# Patient Record
Sex: Female | Born: 1970 | Marital: Married | Smoking: Never smoker
Health system: Southern US, Community
[De-identification: ages and names within clinical notes are randomized; demographics above are authoritative.]

## PROBLEM LIST (undated history)

## (undated) DIAGNOSIS — D649 Anemia, unspecified: Secondary | ICD-10-CM

## (undated) DIAGNOSIS — K219 Gastro-esophageal reflux disease without esophagitis: Secondary | ICD-10-CM

## (undated) DIAGNOSIS — H5703 Miosis: Secondary | ICD-10-CM

## (undated) DIAGNOSIS — K2950 Unspecified chronic gastritis without bleeding: Secondary | ICD-10-CM

## (undated) DIAGNOSIS — B001 Herpesviral vesicular dermatitis: Secondary | ICD-10-CM

## (undated) DIAGNOSIS — L509 Urticaria, unspecified: Secondary | ICD-10-CM

## (undated) DIAGNOSIS — E559 Vitamin D deficiency, unspecified: Secondary | ICD-10-CM

## (undated) DIAGNOSIS — H5203 Hypermetropia, bilateral: Secondary | ICD-10-CM

## (undated) DIAGNOSIS — N939 Abnormal uterine and vaginal bleeding, unspecified: Secondary | ICD-10-CM

## (undated) DIAGNOSIS — H52223 Regular astigmatism, bilateral: Secondary | ICD-10-CM

## (undated) DIAGNOSIS — L719 Rosacea, unspecified: Secondary | ICD-10-CM

## (undated) DIAGNOSIS — T7840XA Allergy, unspecified, initial encounter: Secondary | ICD-10-CM

## (undated) DIAGNOSIS — H04123 Dry eye syndrome of bilateral lacrimal glands: Secondary | ICD-10-CM

## (undated) DIAGNOSIS — J45909 Unspecified asthma, uncomplicated: Secondary | ICD-10-CM

## (undated) DIAGNOSIS — E7849 Other hyperlipidemia: Secondary | ICD-10-CM

## (undated) HISTORY — DX: Gastro-esophageal reflux disease without esophagitis: K21.9

## (undated) HISTORY — DX: Unspecified asthma, uncomplicated: J45.909

## (undated) HISTORY — DX: Vitamin D deficiency, unspecified: E55.9

## (undated) HISTORY — DX: Allergy, unspecified, initial encounter: T78.40XA

---
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
## 2012-09-29 HISTORY — PX: ESOPHAGOGASTRODUODENOSCOPY: SHX1529

## 2013-02-09 LAB — HM PAP SMEAR: HM Pap smear: NEGATIVE

## 2013-08-01 LAB — HM MAMMOGRAPHY: HM MAMMO: NORMAL

## 2014-01-04 ENCOUNTER — Telehealth: Payer: Self-pay | Admitting: *Deleted

## 2014-01-04 ENCOUNTER — Other Ambulatory Visit: Payer: Self-pay | Admitting: Adult Health

## 2014-01-04 ENCOUNTER — Encounter: Payer: Self-pay | Admitting: Adult Health

## 2014-01-04 ENCOUNTER — Encounter (INDEPENDENT_AMBULATORY_CARE_PROVIDER_SITE_OTHER): Payer: Self-pay

## 2014-01-04 ENCOUNTER — Ambulatory Visit (INDEPENDENT_AMBULATORY_CARE_PROVIDER_SITE_OTHER): Payer: 59 | Admitting: Adult Health

## 2014-01-04 ENCOUNTER — Encounter: Payer: Self-pay | Admitting: *Deleted

## 2014-01-04 VITALS — BP 102/60 | HR 85 | Temp 98.2°F | Resp 14 | Ht 58.5 in | Wt 102.0 lb

## 2014-01-04 DIAGNOSIS — Z1239 Encounter for other screening for malignant neoplasm of breast: Secondary | ICD-10-CM | POA: Insufficient documentation

## 2014-01-04 DIAGNOSIS — E782 Mixed hyperlipidemia: Secondary | ICD-10-CM | POA: Insufficient documentation

## 2014-01-04 DIAGNOSIS — E785 Hyperlipidemia, unspecified: Secondary | ICD-10-CM

## 2014-01-04 DIAGNOSIS — Z0000 Encounter for general adult medical examination without abnormal findings: Secondary | ICD-10-CM

## 2014-01-04 DIAGNOSIS — R202 Paresthesia of skin: Secondary | ICD-10-CM

## 2014-01-04 DIAGNOSIS — R209 Unspecified disturbances of skin sensation: Secondary | ICD-10-CM

## 2014-01-04 DIAGNOSIS — E559 Vitamin D deficiency, unspecified: Secondary | ICD-10-CM

## 2014-01-04 DIAGNOSIS — E7849 Other hyperlipidemia: Secondary | ICD-10-CM | POA: Insufficient documentation

## 2014-01-04 LAB — LIPID PANEL
CHOLESTEROL: 233 mg/dL — AB (ref 0–200)
HDL: 45.7 mg/dL (ref >39.00)
LDL Cholesterol: 150 mg/dL — ABNORMAL HIGH (ref 0–99)
TRIGLYCERIDES: 186 mg/dL — AB (ref 0.0–149.0)
Total CHOL/HDL Ratio: 5
VLDL: 37.2 mg/dL (ref 0.0–40.0)

## 2014-01-04 LAB — COMPREHENSIVE METABOLIC PANEL
ALBUMIN: 3.9 g/dL (ref 3.5–5.2)
ALT: 22 U/L (ref 0–35)
AST: 21 U/L (ref 0–37)
Alkaline Phosphatase: 70 U/L (ref 39–117)
BUN: 8 mg/dL (ref 6–23)
CHLORIDE: 102 meq/L (ref 96–112)
CO2: 26 meq/L (ref 19–32)
CREATININE: 0.5 mg/dL (ref 0.4–1.2)
Calcium: 9.2 mg/dL (ref 8.4–10.5)
GFR: 138.38 mL/min (ref >60.00)
GLUCOSE: 74 mg/dL (ref 70–99)
POTASSIUM: 4.2 meq/L (ref 3.5–5.1)
Sodium: 136 mEq/L (ref 135–145)
Total Bilirubin: 0.6 mg/dL (ref 0.3–1.2)
Total Protein: 7.1 g/dL (ref 6.0–8.3)

## 2014-01-04 LAB — FERRITIN: Ferritin: 6.3 ng/mL — ABNORMAL LOW (ref 10.0–291.0)

## 2014-01-04 LAB — IRON: Iron: 49 ug/dL (ref 42–145)

## 2014-01-04 LAB — VITAMIN B12: Vitamin B-12: 481 pg/mL (ref 211–911)

## 2014-01-04 MED ORDER — FERROUS SULFATE 325 (65 FE) MG PO TABS: 325.0000 mg | ORAL_TABLET | Freq: Two times a day (BID) | ORAL | Status: DC

## 2014-01-04 MED ORDER — ESOMEPRAZOLE MAGNESIUM 40 MG PO CPDR: 40.0000 mg | DELAYED_RELEASE_CAPSULE | ORAL | Status: DC

## 2014-01-04 MED ORDER — RANITIDINE HCL 150 MG PO TABS: 150.0000 mg | ORAL_TABLET | Freq: Two times a day (BID) | ORAL | Status: DC

## 2014-01-04 NOTE — Progress Notes (Signed)
Pre visit review using our clinic review tool, if applicable. No additional management support is needed unless otherwise documented below in the visit note. 

## 2014-01-04 NOTE — Progress Notes (Signed)
Patient ID: Meredith Carpenter, female   DOB: 25-Aug-1971, 43 y.o.   MRN: [id number]    Subjective:    Patient ID: Meredith Carpenter, female    DOB: 1971/03/31, 43 y.o.   MRN: [id number]  HPI  Patient is a pleasant 43 year old female who presents to clinic to establish care. She was previously followed by Dr. Clayborn Bigness. She reports a history of elevated cholesterol previously on Crestor. She reports she has not taken it for several months. Family history of heart disease and hyperlipidemia. She is feeling well overall. Up to date on her Pap smear. Will request medical records.    Past Medical History  Diagnosis Date  . Asthma   . GERD (gastroesophageal reflux disease)   . Hyperlipidemia   . Vitamin D deficiency      Past Surgical History  Procedure Laterality Date  . Tonsillectomy and adenoidectomy  1979     Family History  Problem Relation Age of Onset  . Hyperlipidemia Mother   . Heart disease Mother   . Hypertension Mother   . Diabetes Mother   . Hypertension Brother      History   Social History  . Marital Status: Married    Spouse Name: N/A    Number of Children: 2  . Years of Education: 16   Occupational History  . Medical Assistant    Social History Main Topics  . Smoking status: Never Smoker   . Smokeless tobacco: Not on file  . Alcohol Use: No  . Drug Use: No  . Sexual Activity: Yes    Birth Control/ Protection: Condom   Other Topics Concern  . Not on file   Social History Narrative   [name] grew up in [place]. She is married and has 2 children. 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 She enjoys spending time with her family and loves to cook.     Review of Systems  Constitutional: Negative.   HENT: Negative.   Eyes: Negative.   Respiratory: Negative.   Cardiovascular: Negative.   Gastrointestinal: Negative.   Endocrine: Negative.   Genitourinary: Negative.   Musculoskeletal: Negative.   Skin:  Negative.   Allergic/Immunologic: Negative.   Neurological: Negative.   Hematological: Negative.   Psychiatric/Behavioral: Negative.        Objective:  BP 102/60  Pulse 85  Temp(Src) 98.2 F (36.8 C) (Oral)  Ht 4' 10.5" (1.486 m)  Wt 102 lb (46.267 kg)  BMI 20.95 kg/m2  SpO2 99%  LMP 12/21/2013   Physical Exam  Constitutional: She is oriented to person, place, and time. She appears well-developed and well-nourished. No distress.  HENT:  Head: Normocephalic and atraumatic.  Right Ear: External ear normal.  Left Ear: External ear normal.  Nose: Nose normal.  Mouth/Throat: Oropharynx is clear and moist.  Eyes: Conjunctivae and EOM are normal. Pupils are equal, round, and reactive to light.  Neck: Normal range of motion. Neck supple. No tracheal deviation present. No thyromegaly present.  Cardiovascular: Normal rate, regular rhythm, normal heart sounds and intact distal pulses.  Exam reveals no gallop and no friction rub.   No murmur heard. Pulmonary/Chest: Effort normal and breath sounds normal. No respiratory distress. She has no wheezes. She has no rales.  Abdominal: Soft. Bowel sounds are normal. She exhibits no distension and no mass. There is no tenderness. There is no rebound and no guarding.  Musculoskeletal: Normal range of motion. She exhibits no edema and  no tenderness.  Lymphadenopathy:    She has no cervical adenopathy.  Neurological: She is alert and oriented to person, place, and time. She has normal reflexes. No cranial nerve deficit. Coordination normal.  Skin: Skin is warm and dry.  Psychiatric: She has a normal mood and affect. Her behavior is normal. Judgment and thought content normal.       Assessment & Plan:   1. Routine general medical examination at a health care facility Normal physical exam. Up to date on PAP. Next due 2019. Request medical records from previous PCP. Check labs - Comprehensive metabolic panel  2. Tingling in extremities Reports  tingling in lower extremities. Pt is a vegetarian. Will check B12. She also brought some labs with her that were done last year that show low normal iron and ferritin. Recheck labs. - Vitamin B12 - Ferritin - Iron  3. Vitamin D deficiency History of deficiency. Check levels. Continue to follow. - Vit D  25 hydroxy (rtn osteoporosis monitoring)  4. HLD (hyperlipidemia) Check lipids. If elevated will restart statin. She is not overweight and her diet is a healthy diet. Recommend increasing activity level - Lipid panel  5. Screening for breast cancer Mammogram ordered - MM DIGITAL SCREENING BILATERAL; Future

## 2014-01-04 NOTE — Patient Instructions (Addendum)
  Continue taking Nexium 40 mg in the morning.  Then take Zantac 150 mg in the morning and 150 mg at bedtime.  Please have blood work done prior to leaving the office.  I will notify you of results once they are available.  Mammogram screening ordered. We will schedule this for you.

## 2014-01-04 NOTE — Telephone Encounter (Signed)
Pt has requested Zantac 150 as a prescription, rather than purchasing OTC,  it will be cheaper

## 2014-01-05 ENCOUNTER — Encounter: Payer: Self-pay | Admitting: *Deleted

## 2014-01-05 ENCOUNTER — Telehealth: Payer: Self-pay | Admitting: *Deleted

## 2014-01-05 LAB — VITAMIN D 25 HYDROXY (VIT D DEFICIENCY, FRACTURES): VIT D 25 HYDROXY: 46 ng/mL (ref 30–89)

## 2014-01-05 NOTE — Telephone Encounter (Signed)
Pt states that she is okay with starting the Crestor

## 2014-01-05 NOTE — Progress Notes (Signed)
Notified pt. 

## 2014-01-06 ENCOUNTER — Other Ambulatory Visit: Payer: Self-pay | Admitting: Adult Health

## 2014-01-06 MED ORDER — ROSUVASTATIN CALCIUM 5 MG PO TABS: 5.0000 mg | ORAL_TABLET | Freq: Every day | ORAL | Status: DC

## 2014-01-27 ENCOUNTER — Encounter: Payer: Self-pay | Admitting: Adult Health

## 2014-02-23 ENCOUNTER — Telehealth: Payer: Self-pay

## 2014-02-23 ENCOUNTER — Other Ambulatory Visit: Payer: Self-pay | Admitting: Adult Health

## 2014-02-23 DIAGNOSIS — J45909 Unspecified asthma, uncomplicated: Secondary | ICD-10-CM

## 2014-02-23 NOTE — Telephone Encounter (Signed)
I want her to have pulmonary function test done. I am going to refer her to Dr. Lake Bells

## 2014-02-23 NOTE — Telephone Encounter (Signed)
Patient has a history of asthma and sometimes uses a nebulizer. Patient states that her machine is old and would like to get a new one. She will need a written Rx to receive a new machine. She would like [place] to fax the Rx to [facility] at [phone]. Please advice

## 2014-02-24 NOTE — Telephone Encounter (Signed)
Mychart message sent with Raquel's comments.

## 2014-04-10 ENCOUNTER — Institutional Professional Consult (permissible substitution): Payer: 59 | Admitting: Pulmonary Disease

## 2014-04-15 ENCOUNTER — Other Ambulatory Visit: Payer: Self-pay | Admitting: Adult Health

## 2014-04-15 MED ORDER — METFORMIN HCL 500 MG PO TABS: 500.0000 mg | ORAL_TABLET | Freq: Two times a day (BID) | ORAL | Status: DC

## 2014-05-09 ENCOUNTER — Other Ambulatory Visit: Payer: Self-pay | Admitting: Adult Health

## 2014-06-24 ENCOUNTER — Encounter: Payer: Self-pay | Admitting: Internal Medicine

## 2014-06-24 ENCOUNTER — Ambulatory Visit (INDEPENDENT_AMBULATORY_CARE_PROVIDER_SITE_OTHER): Payer: 59 | Admitting: Internal Medicine

## 2014-06-24 VITALS — BP 102/70 | HR 93 | Temp 98.2°F

## 2014-06-24 DIAGNOSIS — J209 Acute bronchitis, unspecified: Secondary | ICD-10-CM

## 2014-06-24 MED ORDER — AZITHROMYCIN 250 MG PO TABS: ORAL_TABLET | ORAL | Status: DC

## 2014-06-24 NOTE — Assessment & Plan Note (Signed)
Initial presentation most consistent with viral URI, now with secondary bronchitis. Will start Azithromycin as this has worked well for her in the past. Given her history of asthma, we discussed that she will need close monitoring for worsening dyspnea. If any progressive symptoms she will call and return to clinic. Follow up prn.

## 2014-06-24 NOTE — Patient Instructions (Signed)
Start Azithromycin.  Continue Mucinex, Claritin or Zyrtec.   Call if symptoms are not improving.

## 2014-06-24 NOTE — Progress Notes (Signed)
   Subjective:    Patient ID: Meredith Carpenter, female    DOB: [birth year], 43 y.o.   MRN: [id number]  HPI 43YO female presents for acute visit.  Tuesday developed sore throat and nasal congestion. Thursday and Friday had high fever 100.73F.  Cough with green sputum last couple of days. No dyspnea or chest pain. Taking Mucinex, Claritin, and Tylenol. Having body aches and some nausea. 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  Review of Systems  Constitutional: Positive for fever, chills and fatigue. Negative for unexpected weight change.  HENT: Positive for congestion, postnasal drip and sore throat. Negative for ear discharge, ear pain, facial swelling, hearing loss, mouth sores, nosebleeds, rhinorrhea, sinus pressure, sneezing, tinnitus, trouble swallowing and voice change.   Eyes: Negative for pain, discharge, redness and visual disturbance.  Respiratory: Positive for cough. Negative for chest tightness, shortness of breath, wheezing and stridor.   Cardiovascular: Negative for chest pain, palpitations and leg swelling.  Gastrointestinal: Positive for nausea. Negative for vomiting, diarrhea and constipation.  Musculoskeletal: Positive for myalgias. Negative for arthralgias, neck pain and neck stiffness.  Skin: Negative for color change and rash.  Neurological: Negative for dizziness, weakness, light-headedness and headaches.  Hematological: Negative for adenopathy.       Objective:    BP 102/70  Pulse 93  Temp(Src) 98.2 F (36.8 C)  SpO2 98% Physical Exam  Constitutional: She is oriented to person, place, and time. She appears well-developed and well-nourished. No distress.  HENT:  Head: Normocephalic and atraumatic.  Right Ear: External ear normal.  Left Ear: External ear normal.  Nose: Nose normal.  Mouth/Throat: Oropharynx is clear and moist. No oropharyngeal exudate.  Eyes: Conjunctivae are normal. Pupils are equal, round, and reactive to light. Right eye exhibits no  discharge. Left eye exhibits no discharge. No scleral icterus.  Neck: Normal range of motion. Neck supple. No tracheal deviation present. No thyromegaly present.  Cardiovascular: Normal rate, regular rhythm, normal heart sounds and intact distal pulses.  Exam reveals no gallop and no friction rub.   No murmur heard. Pulmonary/Chest: Effort normal. No accessory muscle usage. Not tachypneic. No respiratory distress. She has no decreased breath sounds. She has no wheezes. She has rhonchi. She has no rales. She exhibits no tenderness.  Musculoskeletal: Normal range of motion. She exhibits no edema and no tenderness.  Lymphadenopathy:    She has no cervical adenopathy.  Neurological: She is alert and oriented to person, place, and time. No cranial nerve deficit. She exhibits normal muscle tone. Coordination normal.  Skin: Skin is warm and dry. No rash noted. She is not diaphoretic. No erythema. No pallor.  Psychiatric: She has a normal mood and affect. Her behavior is normal. Judgment and thought content normal.          Assessment & Plan:   Problem List Items Addressed This Visit     Unprioritized   Acute bronchitis - Primary     Initial presentation most consistent with viral URI, now with secondary bronchitis. Will start Azithromycin as this has worked well for her in the past. Given her history of asthma, we discussed that she will need close monitoring for worsening dyspnea. If any progressive symptoms she will call and return to clinic. Follow up prn.    Relevant Medications      azithromycin (ZITHROMAX) tablet       Return if symptoms worsen or fail to improve.

## 2014-08-10 ENCOUNTER — Ambulatory Visit (INDEPENDENT_AMBULATORY_CARE_PROVIDER_SITE_OTHER): Payer: 59 | Admitting: Internal Medicine

## 2014-08-10 ENCOUNTER — Encounter: Payer: Self-pay | Admitting: Internal Medicine

## 2014-08-10 VITALS — BP 102/80 | HR 73 | Temp 98.4°F | Wt 101.5 lb

## 2014-08-10 DIAGNOSIS — J209 Acute bronchitis, unspecified: Secondary | ICD-10-CM

## 2014-08-10 MED ORDER — HYDROCODONE-HOMATROPINE 5-1.5 MG/5ML PO SYRP: 5.0000 mL | ORAL_SOLUTION | Freq: Three times a day (TID) | ORAL | Status: DC | PRN

## 2014-08-10 NOTE — Progress Notes (Signed)
Subjective:    Patient ID: Meredith Carpenter, female    DOB: October 26, 1970, 43 y.o.   MRN: [id number]  HPI   Pt presents to the clinic today with  c/ocough x1 week.  Pt experienced similar symptoms one month ago and was prescribed a z-pak and prednisone which she did not take at the time.  Pt started to experience a sore throat 7 days ago which then progressed to her cough.  Pt reports a productive cough with yellow sputum and a scratchy throat.  Pt reports cough is worse at night, has tried mucinex and delsym with little relief.  Pt has been taking z-pak and prednisone before being seen.  Pt denies fever, N/V, SOB or diarrhea. She does have a history of asthma. She has not had sick contacts that she is aware of.    Review of Systems  Past Medical History  Diagnosis Date  . Asthma   . GERD (gastroesophageal reflux disease)   . Hyperlipidemia   . Vitamin D deficiency     Current Outpatient Prescriptions  Medication Sig Dispense Refill  . calcium-vitamin D (OSCAL WITH D) 500-200 MG-UNIT per tablet Take 1 tablet by mouth.    . CRESTOR 5 MG tablet TAKE 1 TABLET (5 MG TOTAL) BY MOUTH DAILY. 30 tablet 3  . esomeprazole (NEXIUM) 40 MG capsule Take 1 capsule (40 mg total) by mouth every morning. 30 capsule 12  . ferrous sulfate 325 (65 FE) MG tablet Take 1 tablet (325 mg total) by mouth 2 (two) times daily. 60 tablet 5  . Multiple Vitamin (MULTIVITAMIN) capsule Take 1 capsule by mouth daily.    . ranitidine (ZANTAC) 150 MG tablet Take 1 tablet (150 mg total) by mouth 2 (two) times daily. 60 tablet 2  . HYDROcodone-homatropine (HYCODAN) 5-1.5 MG/5ML syrup Take 5 mLs by mouth every 8 (eight) hours as needed for cough. 120 mL 0   No current facility-administered medications for this visit.    No Known Allergies  Family History  Problem Relation Age of Onset  . Hyperlipidemia Mother   . Heart disease Mother   . Hypertension Mother   . Diabetes Mother   . Hypertension Brother     History    Social History  . Marital Status: Married    Spouse Name: N/A    Number of Children: 2  . Years of Education: 16   Occupational History  . Medical Assistant    Social History Main Topics  . Smoking status: Never Smoker   . Smokeless tobacco: Not on file  . Alcohol Use: No  . Drug Use: No  . Sexual Activity: Yes    Birth Control/ Protection: Condom   Other Topics Concern  . Not on file   Social History Narrative   [name] grew up in [place]. She is married and has 2 children. 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 She enjoys spending time with her family and loves to cook.     Constitutional: Denies fever, malaise, fatigue, headache or abrupt weight changes.  HEENT: Positive sore throat.  Denies eye pain, eye redness, ear pain, ringing in the ears, wax buildup, runny nose, nasal congestion, bloody nose.Marland Kitchen Respiratory: Positive cough with sputum production.  Denies difficulty breathing, shortness of breath  Cardiovascular: Denies chest pain, chest tightness, palpitations or swelling in the hands or feet.   No other specific complaints in a complete review of systems (except as listed in  HPI above).      Objective:   Physical Exam   BP 102/80 mmHg  Pulse 73  Temp(Src) 98.4 F (36.9 C) (Oral)  Wt 101 lb 8 oz (46.04 kg)  SpO2 99% Wt Readings from Last 3 Encounters:  08/10/14 101 lb 8 oz (46.04 kg)  01/04/14 102 lb (46.267 kg)    General: Appears her stated age, well developed, well nourished in NAD. HEENT: Head: normal shape and size; Ears: Tm's gray and intact, normal light reflex; Nose: mucosa pink and moist, septum midline; Throat/Mouth: Teeth present, mucosa pink and moist, no exudate, lesions or ulcerations noted.  Cardiovascular: Normal rate and rhythm. S1,S2 noted.  No murmur, rubs or gallops noted. Pulmonary/Chest: Normal effort and positive vesicular breath sounds. No respiratory distress. No wheezes,  rales or ronchi noted.   EKG:  BMET    Component Value Date/Time   NA 136 01/04/2014 1149   K 4.2 01/04/2014 1149   CL 102 01/04/2014 1149   CO2 26 01/04/2014 1149   GLUCOSE 74 01/04/2014 1149   BUN 8 01/04/2014 1149   CREATININE 0.5 01/04/2014 1149   CALCIUM 9.2 01/04/2014 1149    Lipid Panel     Component Value Date/Time   CHOL 233* 01/04/2014 1149   TRIG 186.0* 01/04/2014 1149   HDL 45.70 01/04/2014 1149   CHOLHDL 5 01/04/2014 1149   VLDL 37.2 01/04/2014 1149   LDLCALC 150* 01/04/2014 1149    CBC No results found for: WBC, RBC, HGB, HCT, PLT, MCV, MCH, MCHC, RDW, LYMPHSABS, MONOABS, EOSABS, BASOSABS  Hgb A1C No results found for: HGBA1C      Assessment & Plan:  Acute Bronchitis  Told pt to finish z-pak and prednisone previously prescribed Prescribed Hycodan for nighttime cough relief Salt water gargles and Ibuprofen for sore throat Push fluids and get plenty of rest  -RTC if symptoms worsen or are not relieved in 7 days

## 2014-08-10 NOTE — Patient Instructions (Signed)
Cough, Adult  A cough is a reflex that helps clear your throat and airways. It can help heal the body or may be a reaction to an irritated airway. A cough may only last 2 or 3 weeks (acute) or may last more than 8 weeks (chronic).  CAUSES Acute cough:  Viral or bacterial infections. Chronic cough:  Infections.  Allergies.  Asthma.  Post-nasal drip.  Smoking.  Heartburn or acid reflux.  Some medicines.  Chronic lung problems (COPD).  Cancer. SYMPTOMS   Cough.  Fever.  Chest pain.  Increased breathing rate.  High-pitched whistling sound when breathing (wheezing).  Colored mucus that you cough up (sputum). TREATMENT   A bacterial cough may be treated with antibiotic medicine.  A viral cough must run its course and will not respond to antibiotics.  Your caregiver may recommend other treatments if you have a chronic cough. HOME CARE INSTRUCTIONS   Only take over-the-counter or prescription medicines for pain, discomfort, or fever as directed by your caregiver. Use cough suppressants only as directed by your caregiver.  Use a cold steam vaporizer or humidifier in your bedroom or home to help loosen secretions.  Sleep in a semi-upright position if your cough is worse at night.  Rest as needed.  Stop smoking if you smoke. SEEK IMMEDIATE MEDICAL CARE IF:   You have pus in your sputum.  Your cough starts to worsen.  You cannot control your cough with suppressants and are losing sleep.  You begin coughing up blood.  You have difficulty breathing.  You develop pain which is getting worse or is uncontrolled with medicine.  You have a fever. MAKE SURE YOU:   Understand these instructions.  Will watch your condition.  Will get help right away if you are not doing well or get worse. Document Released: 03/14/2011 Document Revised: 12/08/2011 Document Reviewed: 03/14/2011 ExitCare Patient Information 2015 [employer]. This information is not intended  to replace advice given to you by your health care provider. Make sure you discuss any questions you have with your health care provider.  

## 2014-08-10 NOTE — Progress Notes (Signed)
Pre visit review using our clinic review tool, if applicable. No additional management support is needed unless otherwise documented below in the visit note. 

## 2014-10-16 ENCOUNTER — Other Ambulatory Visit: Payer: Self-pay | Admitting: *Deleted

## 2014-10-16 MED ORDER — RANITIDINE HCL 150 MG PO TABS: 150.0000 mg | ORAL_TABLET | Freq: Two times a day (BID) | ORAL | Status: DC

## 2014-10-19 ENCOUNTER — Telehealth: Payer: Self-pay

## 2014-10-19 MED ORDER — PANTOPRAZOLE SODIUM 20 MG PO TBEC: 20.0000 mg | DELAYED_RELEASE_TABLET | Freq: Every day | ORAL | Status: DC

## 2014-10-19 NOTE — Telephone Encounter (Signed)
What dose?

## 2014-10-19 NOTE — Telephone Encounter (Signed)
The patient needs her medication changed to protonix due to the nexium not being covered by her insurance. Thanks!

## 2014-10-19 NOTE — Telephone Encounter (Signed)
Rx sent to pharmacy by escript  

## 2014-10-19 NOTE — Telephone Encounter (Signed)
Protonix 20 mg daily. #30 2 refills. Thanks!

## 2014-10-26 ENCOUNTER — Ambulatory Visit (INDEPENDENT_AMBULATORY_CARE_PROVIDER_SITE_OTHER): Payer: 59 | Admitting: Nurse Practitioner

## 2014-10-26 ENCOUNTER — Encounter: Payer: Self-pay | Admitting: Nurse Practitioner

## 2014-10-26 VITALS — BP 98/64 | HR 72 | Temp 97.7°F | Resp 12 | Wt 103.0 lb

## 2014-10-26 DIAGNOSIS — Z13220 Encounter for screening for lipoid disorders: Secondary | ICD-10-CM

## 2014-10-26 DIAGNOSIS — Z130 Encounter for screening for diseases of the blood and blood-forming organs and certain disorders involving the immune mechanism: Secondary | ICD-10-CM

## 2014-10-26 DIAGNOSIS — E785 Hyperlipidemia, unspecified: Secondary | ICD-10-CM

## 2014-10-26 DIAGNOSIS — Z0000 Encounter for general adult medical examination without abnormal findings: Secondary | ICD-10-CM

## 2014-10-26 DIAGNOSIS — Z131 Encounter for screening for diabetes mellitus: Secondary | ICD-10-CM

## 2014-10-26 DIAGNOSIS — K219 Gastro-esophageal reflux disease without esophagitis: Secondary | ICD-10-CM

## 2014-10-26 MED ORDER — PANTOPRAZOLE SODIUM 40 MG PO TBEC: 40.0000 mg | DELAYED_RELEASE_TABLET | Freq: Every day | ORAL | Status: DC

## 2014-10-26 NOTE — Progress Notes (Signed)
Pre visit review using our clinic review tool, if applicable. No additional management support is needed unless otherwise documented below in the visit note. 

## 2014-10-26 NOTE — Progress Notes (Signed)
   Subjective:    Patient ID: Meredith Carpenter, female    DOB: 02-26-1971, 43 y.o.   MRN: [id number]  HPI  1) Ms. Schoeller is a 44 yo female with a CC of GERD.   2013 Endoscopy, shows some changes. Is wondering if she should be followed by GI. States last GI doctor left.  No coughing up blood, blood in stool or vomit   Burning in throat when Nexium was not working and yesterday. She eats bland for 24 hours and feels this helps, however she does not want to continue to eat bland.   Review of Systems  Constitutional: Negative for fever, chills, diaphoresis and fatigue.  Respiratory: Negative for chest tightness, shortness of breath and wheezing.   Cardiovascular: Negative for chest pain, palpitations and leg swelling.  Gastrointestinal: Negative for nausea, vomiting, abdominal pain, diarrhea, abdominal distention and rectal pain.       Reflux  Skin: Negative for rash.  Neurological: Negative for dizziness, weakness, numbness and headaches.  Psychiatric/Behavioral: The patient is not nervous/anxious.        Objective:   Physical Exam  Constitutional: She is oriented to person, place, and time. She appears well-developed and well-nourished. No distress.  HENT:  Head: Normocephalic and atraumatic.  Right Ear: External ear normal.  Left Ear: External ear normal.  Cardiovascular: Normal rate, regular rhythm, normal heart sounds and intact distal pulses.  Exam reveals no gallop and no friction rub.   No murmur heard. Pulmonary/Chest: Effort normal and breath sounds normal. No respiratory distress. She has no wheezes. She has no rales. She exhibits no tenderness.  Abdominal: Soft. Bowel sounds are normal. She exhibits no distension and no mass. There is no tenderness. There is no rebound and no guarding.  Neurological: She is alert and oriented to person, place, and time. No cranial nerve deficit. She exhibits normal muscle tone. Coordination normal.  Skin: Skin is warm and dry. No rash noted. She  is not diaphoretic.  Psychiatric: She has a normal mood and affect. Her behavior is normal. Judgment and thought content normal.       Assessment & Plan:

## 2014-10-26 NOTE — Patient Instructions (Signed)
Gastroesophageal Reflux Disease, Adult Gastroesophageal reflux disease (GERD) happens when acid from your stomach flows up into the esophagus. When acid comes in contact with the esophagus, the acid causes soreness (inflammation) in the esophagus. Over time, GERD may create small holes (ulcers) in the lining of the esophagus. CAUSES   Increased body weight. This puts pressure on the stomach, making acid rise from the stomach into the esophagus.  Smoking. This increases acid production in the stomach.  Drinking alcohol. This causes decreased pressure in the lower esophageal sphincter (valve or ring of muscle between the esophagus and stomach), allowing acid from the stomach into the esophagus.  Late evening meals and a full stomach. This increases pressure and acid production in the stomach.  A malformed lower esophageal sphincter. Sometimes, no cause is found. SYMPTOMS   Burning pain in the lower part of the mid-chest behind the breastbone and in the mid-stomach area. This may occur twice a week or more often.  Trouble swallowing.  Sore throat.  Dry cough.  Asthma-like symptoms including chest tightness, shortness of breath, or wheezing. DIAGNOSIS  Your caregiver may be able to diagnose GERD based on your symptoms. In some cases, X-rays and other tests may be done to check for complications or to check the condition of your stomach and esophagus. TREATMENT  Your caregiver may recommend over-the-counter or prescription medicines to help decrease acid production. Ask your caregiver before starting or adding any new medicines.  HOME CARE INSTRUCTIONS   Change the factors that you can control. Ask your caregiver for guidance concerning weight loss, quitting smoking, and alcohol consumption.  Avoid foods and drinks that make your symptoms worse, such as:  Caffeine or alcoholic drinks.  Chocolate.  Peppermint or mint flavorings.  Garlic and onions.  Spicy foods.  Citrus fruits,  such as oranges, lemons, or limes.  Tomato-based foods such as sauce, chili, salsa, and pizza.  Fried and fatty foods.  Avoid lying down for the 3 hours prior to your bedtime or prior to taking a nap.  Eat small, frequent meals instead of large meals.  Wear loose-fitting clothing. Do not wear anything tight around your waist that causes pressure on your stomach.  Raise the head of your bed 6 to 8 inches with wood blocks to help you sleep. Extra pillows will not help.  Only take over-the-counter or prescription medicines for pain, discomfort, or fever as directed by your caregiver.  Do not take aspirin, ibuprofen, or other nonsteroidal anti-inflammatory drugs (NSAIDs). SEEK IMMEDIATE MEDICAL CARE IF:   You have pain in your arms, neck, jaw, teeth, or back.  Your pain increases or changes in intensity or duration.  You develop nausea, vomiting, or sweating (diaphoresis).  You develop shortness of breath, or you faint.  Your vomit is green, yellow, black, or looks like coffee grounds or blood.  Your stool is red, bloody, or black. These symptoms could be signs of other problems, such as heart disease, gastric bleeding, or esophageal bleeding. MAKE SURE YOU:   Understand these instructions.  Will watch your condition.  Will get help right away if you are not doing well or get worse. Document Released: 06/25/2005 Document Revised: 12/08/2011 Document Reviewed: 04/04/2011 ExitCare Patient Information 2015 [employer]. This information is not intended to replace advice given to you by your health care provider. Make sure you discuss any questions you have with your health care provider.  

## 2014-10-27 ENCOUNTER — Encounter: Payer: Self-pay | Admitting: Nurse Practitioner

## 2014-10-27 LAB — CBC WITH DIFFERENTIAL/PLATELET
BASOS ABS: 0.1 10*3/uL (ref 0.0–0.1)
Basophils Relative: 1 % (ref 0.0–3.0)
EOS ABS: 0.5 10*3/uL (ref 0.0–0.7)
EOS PCT: 6.6 % — AB (ref 0.0–5.0)
HCT: 38.4 % (ref 36.0–46.0)
HEMOGLOBIN: 13.3 g/dL (ref 12.0–15.0)
Lymphocytes Relative: 33.5 % (ref 12.0–46.0)
Lymphs Abs: 2.4 10*3/uL (ref 0.7–4.0)
MCHC: 34.7 g/dL (ref 30.0–36.0)
MCV: 85.9 fl (ref 78.0–100.0)
Monocytes Absolute: 0.4 10*3/uL (ref 0.1–1.0)
Monocytes Relative: 5.9 % (ref 3.0–12.0)
NEUTROS ABS: 3.8 10*3/uL (ref 1.4–7.7)
Neutrophils Relative %: 53 % (ref 43.0–77.0)
Platelets: 276 10*3/uL (ref 150.0–400.0)
RBC: 4.47 Mil/uL (ref 3.87–5.11)
RDW: 13.3 % (ref 11.5–15.5)
WBC: 7.2 10*3/uL (ref 4.0–10.5)

## 2014-10-27 LAB — LIPID PANEL
CHOLESTEROL: 184 mg/dL (ref 0–200)
HDL: 38.5 mg/dL — AB (ref >39.00)
Total CHOL/HDL Ratio: 5
Triglycerides: 424 mg/dL — ABNORMAL HIGH (ref 0.0–149.0)

## 2014-10-27 LAB — HEMOGLOBIN A1C: HEMOGLOBIN A1C: 5.8 % (ref 4.6–6.5)

## 2014-10-27 LAB — LDL CHOLESTEROL, DIRECT: Direct LDL: 112 mg/dL

## 2014-10-29 DIAGNOSIS — K219 Gastro-esophageal reflux disease without esophagitis: Secondary | ICD-10-CM | POA: Insufficient documentation

## 2014-10-29 NOTE — Assessment & Plan Note (Signed)
Worsening. Refilled protonix 40 mg.

## 2014-10-29 NOTE — Assessment & Plan Note (Signed)
Pt did not want to return for her lipid panel. Obtain lipid panel.

## 2014-10-29 NOTE — Assessment & Plan Note (Signed)
Pt wanted to get her lab work today so she does not have to return. Obtain Lipid, A1c, CBC w/ diff. Referred to GI for further work up.

## 2014-10-31 ENCOUNTER — Other Ambulatory Visit: Payer: Self-pay | Admitting: Nurse Practitioner

## 2014-10-31 MED ORDER — ACYCLOVIR 400 MG PO TABS: 400.0000 mg | ORAL_TABLET | Freq: Every day | ORAL | Status: DC

## 2014-11-01 ENCOUNTER — Telehealth: Payer: Self-pay | Admitting: *Deleted

## 2014-11-01 ENCOUNTER — Other Ambulatory Visit: Payer: Self-pay | Admitting: Nurse Practitioner

## 2014-11-01 DIAGNOSIS — K2970 Gastritis, unspecified, without bleeding: Secondary | ICD-10-CM

## 2014-11-01 DIAGNOSIS — K2990 Gastroduodenitis, unspecified, without bleeding: Principal | ICD-10-CM

## 2014-11-01 NOTE — Telephone Encounter (Signed)
Referred her.

## 2014-11-01 NOTE — Telephone Encounter (Signed)
Pt called requesting referral sent to [facility] instead of [facility].  Pt states she does not want to drive to [facility], she already has appoint scheduled but needs referral to follow.

## 2014-12-25 ENCOUNTER — Ambulatory Visit: Payer: Self-pay | Admitting: Surgery

## 2015-01-10 ENCOUNTER — Ambulatory Visit: Admit: 2015-01-10 | Disposition: A | Discharge status: Home / Self Care | Payer: Self-pay | Admitting: Gastroenterology

## 2015-03-26 ENCOUNTER — Encounter: Payer: Self-pay | Admitting: Nurse Practitioner

## 2015-03-26 ENCOUNTER — Ambulatory Visit (INDEPENDENT_AMBULATORY_CARE_PROVIDER_SITE_OTHER): Payer: 59 | Admitting: Nurse Practitioner

## 2015-03-26 VITALS — BP 99/66 | HR 69 | Temp 98.3°F | Resp 14 | Ht 59.0 in | Wt 103.6 lb

## 2015-03-26 DIAGNOSIS — Z862 Personal history of diseases of the blood and blood-forming organs and certain disorders involving the immune mechanism: Secondary | ICD-10-CM | POA: Insufficient documentation

## 2015-03-26 DIAGNOSIS — E785 Hyperlipidemia, unspecified: Secondary | ICD-10-CM | POA: Diagnosis not present

## 2015-03-26 DIAGNOSIS — Z1239 Encounter for other screening for malignant neoplasm of breast: Secondary | ICD-10-CM | POA: Diagnosis not present

## 2015-03-26 DIAGNOSIS — E781 Pure hyperglyceridemia: Secondary | ICD-10-CM | POA: Diagnosis not present

## 2015-03-26 DIAGNOSIS — Z0000 Encounter for general adult medical examination without abnormal findings: Secondary | ICD-10-CM | POA: Diagnosis not present

## 2015-03-26 DIAGNOSIS — D649 Anemia, unspecified: Secondary | ICD-10-CM

## 2015-03-26 DIAGNOSIS — R202 Paresthesia of skin: Secondary | ICD-10-CM

## 2015-03-26 DIAGNOSIS — K219 Gastro-esophageal reflux disease without esophagitis: Secondary | ICD-10-CM | POA: Diagnosis not present

## 2015-03-26 DIAGNOSIS — E559 Vitamin D deficiency, unspecified: Secondary | ICD-10-CM

## 2015-03-26 LAB — LIPASE: LIPASE: 4 U/L — AB (ref 11.0–59.0)

## 2015-03-26 LAB — CBC WITH DIFFERENTIAL/PLATELET
BASOS ABS: 0 10*3/uL (ref 0.0–0.1)
Basophils Relative: 0.3 % (ref 0.0–3.0)
Eosinophils Absolute: 0.6 10*3/uL (ref 0.0–0.7)
Eosinophils Relative: 7.2 % — ABNORMAL HIGH (ref 0.0–5.0)
HEMATOCRIT: 39.6 % (ref 36.0–46.0)
Hemoglobin: 13.3 g/dL (ref 12.0–15.0)
LYMPHS ABS: 2.4 10*3/uL (ref 0.7–4.0)
Lymphocytes Relative: 27.2 % (ref 12.0–46.0)
MCHC: 33.6 g/dL (ref 30.0–36.0)
MCV: 87.6 fl (ref 78.0–100.0)
MONO ABS: 0.4 10*3/uL (ref 0.1–1.0)
Monocytes Relative: 4.8 % (ref 3.0–12.0)
Neutro Abs: 5.3 10*3/uL (ref 1.4–7.7)
Neutrophils Relative %: 60.5 % (ref 43.0–77.0)
PLATELETS: 281 10*3/uL (ref 150.0–400.0)
RBC: 4.52 Mil/uL (ref 3.87–5.11)
RDW: 13.7 % (ref 11.5–15.5)
WBC: 8.8 10*3/uL (ref 4.0–10.5)

## 2015-03-26 LAB — FERRITIN: Ferritin: 14.2 ng/mL (ref 10.0–291.0)

## 2015-03-26 LAB — LIPID PANEL
Cholesterol: 188 mg/dL (ref 0–200)
HDL: 44.6 mg/dL (ref >39.00)
LDL Cholesterol: 108 mg/dL — ABNORMAL HIGH (ref 0–99)
NONHDL: 143.4
Total CHOL/HDL Ratio: 4
Triglycerides: 176 mg/dL — ABNORMAL HIGH (ref 0.0–149.0)
VLDL: 35.2 mg/dL (ref 0.0–40.0)

## 2015-03-26 LAB — COMPREHENSIVE METABOLIC PANEL
ALT: 29 U/L (ref 0–35)
AST: 23 U/L (ref 0–37)
Albumin: 4.3 g/dL (ref 3.5–5.2)
Alkaline Phosphatase: 68 U/L (ref 39–117)
BILIRUBIN TOTAL: 0.4 mg/dL (ref 0.2–1.2)
BUN: 9 mg/dL (ref 6–23)
CALCIUM: 9.5 mg/dL (ref 8.4–10.5)
CO2: 28 mEq/L (ref 19–32)
Chloride: 102 mEq/L (ref 96–112)
Creatinine, Ser: 0.58 mg/dL (ref 0.40–1.20)
GFR: 121.27 mL/min (ref >60.00)
Glucose, Bld: 83 mg/dL (ref 70–99)
Potassium: 4.2 mEq/L (ref 3.5–5.1)
Sodium: 136 mEq/L (ref 135–145)
Total Protein: 7.1 g/dL (ref 6.0–8.3)

## 2015-03-26 LAB — VITAMIN B12: VITAMIN B 12: 552 pg/mL (ref 211–911)

## 2015-03-26 LAB — TSH: TSH: 2.2 u[IU]/mL (ref 0.35–4.50)

## 2015-03-26 LAB — VITAMIN D 25 HYDROXY (VIT D DEFICIENCY, FRACTURES): VITD: 33.88 ng/mL (ref 30.00–100.00)

## 2015-03-26 LAB — AMYLASE: AMYLASE: 27 U/L (ref 27–131)

## 2015-03-26 NOTE — Patient Instructions (Signed)

## 2015-03-26 NOTE — Progress Notes (Signed)
Pre visit review using our clinic review tool, if applicable. No additional management support is needed unless otherwise documented below in the visit note. 

## 2015-03-26 NOTE — Progress Notes (Signed)
Subjective:    Patient ID: Meredith Carpenter, female    DOB: 12-Sep-1971, 44 y.o.   MRN: [id number]  HPI  Meredith Carpenter is a 44 yo female here for her annual exam today.   1) Health Maintenance-   Diet- No changes  Exercise- No changes   Immunizations- Unsure of last tdap   Mammogram- Refused, does occasional self breast exams at home   Pap- 2014, Breast exam 2014, no OB/GYN  Eye Exam- Jan/ Feb 2016, no problems   Dental Exam- UTD   2) Chronic Problems-  Vitamin D deficiency- taking Calcium + vitamin D   GERD w/ gastritis- Meredith Carpenter, gastritis    Taking protonix 40 mg once daily (twice daily if needed) and supplements with ranitidine (script, not OTC).   Anemia- Fe 325 mg   Tingling in extremities- Cramping at night time   Hyperlipidemia- Crestor 5 mg started at 35   Back pain- heating pad    Review of Systems  Constitutional: Negative for fever, chills, diaphoresis, fatigue and unexpected weight change.  HENT: Negative for tinnitus and trouble swallowing.   Eyes: Negative for visual disturbance.  Respiratory: Negative for chest tightness, shortness of breath and wheezing.   Cardiovascular: Negative for chest pain, palpitations and leg swelling.  Gastrointestinal: Negative for nausea, vomiting, abdominal pain, diarrhea, constipation and blood in stool.  Endocrine: Negative for polydipsia, polyphagia and polyuria.  Genitourinary: Negative for dysuria, hematuria, vaginal discharge and vaginal pain.  Musculoskeletal: Positive for back pain. Negative for myalgias, arthralgias and gait problem.       Bilateral low back pain  Skin: Negative for color change and rash.  Neurological: Negative for dizziness, weakness, numbness and headaches.  Hematological: Does not bruise/bleed easily.  Psychiatric/Behavioral: Negative for suicidal ideas and sleep disturbance. The patient is not nervous/anxious.    Past Medical History  Diagnosis Date  . Asthma   . GERD (gastroesophageal reflux  disease)   . Hyperlipidemia   . Vitamin D deficiency     History   Social History  . Marital Status: Married    Spouse Name: N/A  . Number of Children: 2  . Years of Education: 16   Occupational History  . Medical Assistant    Social History Main Topics  . Smoking status: Never Smoker   . Smokeless tobacco: Not on file  . Alcohol Use: No  . Drug Use: No  . Sexual Activity:    Partners: Female    Patent examiner Protection: Condom     Comment: 1 partner    Other Topics Concern  . Not on file   Social History Narrative   Meredith Carpenter grew up in [place]. She is married and has 2 children. 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 She enjoys spending time with her family and loves to cook.    Past Surgical History  Procedure Laterality Date  . Tonsillectomy and adenoidectomy  1979    Family History  Problem Relation Age of Onset  . Hyperlipidemia Mother   . Heart disease Mother   . Hypertension Mother   . Diabetes Mother   . Hypertension Brother     No Known Allergies  Current Outpatient Prescriptions on File Prior to Visit  Medication Sig Dispense Refill  . acyclovir (ZOVIRAX) 400 MG tablet Take 1 tablet (400 mg total) by mouth 5 (five) times daily. 35 tablet 0  . albuterol (PROVENTIL HFA;VENTOLIN HFA) 108 (90 BASE) MCG/ACT  inhaler Inhale 1 puff into the lungs every 6 (six) hours as needed for wheezing or shortness of breath.    . calcium-vitamin D (OSCAL WITH D) 500-200 MG-UNIT per tablet Take 1 tablet by mouth.    . CRESTOR 5 MG tablet TAKE 1 TABLET (5 MG TOTAL) BY MOUTH DAILY. 30 tablet 3  . esomeprazole (NEXIUM) 40 MG capsule Take 1 capsule (40 mg total) by mouth every morning. 30 capsule 12  . ferrous sulfate 325 (65 FE) MG tablet Take 1 tablet (325 mg total) by mouth 2 (two) times daily. 60 tablet 5  . HYDROcodone-homatropine (HYCODAN) 5-1.5 MG/5ML syrup Take 5 mLs by mouth every 8 (eight) hours as needed for  cough. 120 mL 0  . metroNIDAZOLE (METROGEL) 0.75 % vaginal gel Place 1 Applicatorful vaginally 2 (two) times daily.    . mometasone (ELOCON) 0.1 % cream Apply 1 application topically daily.    . Multiple Vitamin (MULTIVITAMIN) capsule Take 1 capsule by mouth daily.    . pantoprazole (PROTONIX) 40 MG tablet Take 1 tablet (40 mg total) by mouth daily. 30 tablet 11  . ranitidine (ZANTAC) 150 MG tablet Take 1 tablet (150 mg total) by mouth 2 (two) times daily. 60 tablet 2   No current facility-administered medications on file prior to visit.      Objective:   Physical Exam  Constitutional: She is oriented to person, place, and time. She appears well-developed and well-nourished. No distress.  BP 99/66 mmHg  Pulse 69  Temp(Src) 98.3 F (36.8 C)  Resp 14  Ht 4\' 11"  (1.499 m)  Wt 103 lb 9.6 oz (46.993 kg)  BMI 20.91 kg/m2  SpO2 99%   HENT:  Head: Normocephalic and atraumatic.  Right Ear: External ear normal.  Left Ear: External ear normal.  Mouth/Throat: No oropharyngeal exudate.  TM's clear bilaterally  Eyes: EOM are normal. Pupils are equal, round, and reactive to light. Right eye exhibits no discharge. Left eye exhibits no discharge. No scleral icterus.  Neck: Normal range of motion. Neck supple. No thyromegaly present.  Cardiovascular: Normal rate, regular rhythm, normal heart sounds and intact distal pulses.  Exam reveals no gallop and no friction rub.   No murmur heard. Pulmonary/Chest: Effort normal and breath sounds normal. No respiratory distress. She has no wheezes. She has no rales. She exhibits no tenderness.  Breast exam was without significant findings  Abdominal: Soft. Bowel sounds are normal. She exhibits no distension and no mass. There is no tenderness. There is no rebound and no guarding.  White striae on abdomen  Genitourinary:  Deferred due to no symptoms and PAP in 2014, declined pelvic today  Musculoskeletal: Normal range of motion. She exhibits no edema or  tenderness.  Lymphadenopathy:    She has no cervical adenopathy.  Neurological: She is alert and oriented to person, place, and time. No cranial nerve deficit. She exhibits normal muscle tone. Coordination normal.  Skin: Skin is warm and dry. No rash noted. She is not diaphoretic.  Psychiatric: She has a normal mood and affect. Her behavior is normal. Judgment and thought content normal.      Assessment & Plan:

## 2015-04-08 NOTE — Assessment & Plan Note (Signed)
Seeing Dr. Allen Norris for gastritis. Taking Protonix 40 mg daily and supplements with ranitidine.

## 2015-04-08 NOTE — Assessment & Plan Note (Addendum)
CBC w/ diff, and ferritin obtained today. Will follow. Continue on iron 325 mg daily

## 2015-04-08 NOTE — Assessment & Plan Note (Signed)
Obtain B12 level today. I believe this is may be positional.

## 2015-04-08 NOTE — Assessment & Plan Note (Signed)
Takes daily vitamin D-Calcium supplements. Will obtain level.

## 2015-04-08 NOTE — Assessment & Plan Note (Signed)
Repeat lipid panel. Also checking amylase/lipase for pancreatitis. Will follow.

## 2015-04-08 NOTE — Assessment & Plan Note (Signed)
Discussed acute and chronic issues. Reviewed health maintenance measures, PFSHx, and immunizations. Obtain routine labs TSH, Lipid panel, CBC w/ diff, A1c, and CMET.  Breast exam no significant findings, refuses mammogram- discussed risks/benefits and pt verbalized understanding  Refused PAP today and pelvic exam.

## 2015-05-28 ENCOUNTER — Encounter: Payer: Self-pay | Admitting: Nurse Practitioner

## 2015-05-29 ENCOUNTER — Other Ambulatory Visit: Payer: Self-pay | Admitting: *Deleted

## 2015-05-29 MED ORDER — ALBUTEROL SULFATE HFA 108 (90 BASE) MCG/ACT IN AERS: 1.0000 | INHALATION_SPRAY | Freq: Four times a day (QID) | RESPIRATORY_TRACT | Status: DC | PRN

## 2015-05-30 ENCOUNTER — Other Ambulatory Visit: Payer: Self-pay | Admitting: Nurse Practitioner

## 2015-08-28 ENCOUNTER — Other Ambulatory Visit: Payer: Self-pay | Admitting: Nurse Practitioner

## 2015-08-28 ENCOUNTER — Encounter: Payer: Self-pay | Admitting: Nurse Practitioner

## 2015-08-28 MED ORDER — RANITIDINE HCL 150 MG PO TABS: 150.0000 mg | ORAL_TABLET | Freq: Two times a day (BID) | ORAL | Status: DC

## 2015-11-26 ENCOUNTER — Ambulatory Visit (INDEPENDENT_AMBULATORY_CARE_PROVIDER_SITE_OTHER): Payer: 59 | Admitting: Family Medicine

## 2015-11-26 ENCOUNTER — Encounter: Payer: Self-pay | Admitting: Family Medicine

## 2015-11-26 VITALS — BP 123/83 | HR 80 | Temp 97.8°F | Resp 16 | Ht 60.0 in | Wt 103.6 lb

## 2015-11-26 DIAGNOSIS — J301 Allergic rhinitis due to pollen: Secondary | ICD-10-CM | POA: Diagnosis not present

## 2015-11-26 DIAGNOSIS — B349 Viral infection, unspecified: Secondary | ICD-10-CM | POA: Diagnosis not present

## 2015-11-26 DIAGNOSIS — J029 Acute pharyngitis, unspecified: Secondary | ICD-10-CM | POA: Diagnosis not present

## 2015-11-26 DIAGNOSIS — H66001 Acute suppurative otitis media without spontaneous rupture of ear drum, right ear: Secondary | ICD-10-CM

## 2015-11-26 LAB — POCT INFLUENZA A/B
INFLUENZA A, POC: NEGATIVE
INFLUENZA B, POC: NEGATIVE

## 2015-11-26 LAB — POCT RAPID STREP A (OFFICE): Rapid Strep A Screen: NEGATIVE

## 2015-11-26 MED ORDER — FLUTICASONE PROPIONATE 50 MCG/ACT NA SUSP: 2.0000 | Freq: Every day | NASAL | Status: DC

## 2015-11-26 MED ORDER — AMOXICILLIN-POT CLAVULANATE 875-125 MG PO TABS: 1.0000 | ORAL_TABLET | Freq: Two times a day (BID) | ORAL | Status: DC

## 2015-11-26 NOTE — Progress Notes (Signed)
Subjective:    Patient ID: Meredith Carpenter, female    DOB: July 15, 1971, 45 y.o.   MRN: [id number]  HPI: Meredith Carpenter is a 45 y.o. female presenting on 11/26/2015 for Sore Throat   HPI  Pt presents as new patient for sick visit with sore throat, body aches, malaise x 3 days. Symptoms started over the weekend. Throat is sore. No fevers. No chest tightness or trouble breathing. Runy nose over the weekend. Took zyrtec. Took tylenol for bodyaches. Pt reports feeling better today. Throat is scratchy. No ear pain or trouble hearing. No HA.  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   Past Medical History  Diagnosis Date  . Asthma   . GERD (gastroesophageal reflux disease)   . Hyperlipidemia   . Vitamin D deficiency     Current Outpatient Prescriptions on File Prior to Visit  Medication Sig  . acyclovir (ZOVIRAX) 400 MG tablet Take 1 tablet (400 mg total) by mouth 5 (five) times daily.  Marland Kitchen albuterol (PROVENTIL HFA;VENTOLIN HFA) 108 (90 BASE) MCG/ACT inhaler Inhale 1 puff into the lungs every 6 (six) hours as needed for wheezing or shortness of breath.  . calcium-vitamin D (OSCAL WITH D) 500-200 MG-UNIT per tablet Take 1 tablet by mouth.  . CRESTOR 5 MG tablet TAKE 1 TABLET (5 MG TOTAL) BY MOUTH DAILY.  Marland Kitchen esomeprazole (NEXIUM) 40 MG capsule Take 1 capsule (40 mg total) by mouth every morning.  . ferrous sulfate 325 (65 FE) MG tablet Take 1 tablet (325 mg total) by mouth 2 (two) times daily.  Marland Kitchen HYDROcodone-homatropine (HYCODAN) 5-1.5 MG/5ML syrup Take 5 mLs by mouth every 8 (eight) hours as needed for cough.  . metroNIDAZOLE (METROGEL) 0.75 % vaginal gel Place 1 Applicatorful vaginally 2 (two) times daily.  . mometasone (ELOCON) 0.1 % cream Apply 1 application topically daily.  . Multiple Vitamin (MULTIVITAMIN) capsule Take 1 capsule by mouth daily.  . pantoprazole (PROTONIX) 40 MG tablet Take 1 tablet (40 mg total) by mouth daily.  . ranitidine (ZANTAC) 150 MG tablet  Take 1 tablet (150 mg total) by mouth 2 (two) times daily.   No current facility-administered medications on file prior to visit.    Review of Systems  Constitutional: Positive for fatigue. Negative for fever and chills.  HENT: Positive for ear pain, rhinorrhea and sore throat. Negative for congestion, sinus pressure, sneezing, trouble swallowing and voice change.   Respiratory: Negative for cough, chest tightness and wheezing.   Cardiovascular: Negative for chest pain, palpitations and leg swelling.  Gastrointestinal: Negative for nausea, vomiting and abdominal pain.  Musculoskeletal: Positive for myalgias.  Neurological: Positive for weakness.   Per HPI unless specifically indicated above     Objective:    BP 123/83 mmHg  Pulse 80  Temp(Src) 97.8 F (36.6 C) (Oral)  Resp 16  Ht 5' (1.524 m)  Wt 103 lb 9.6 oz (46.993 kg)  BMI 20.23 kg/m2  SpO2 100%  LMP 10/30/2015 (Approximate)  Wt Readings from Last 3 Encounters:  11/26/15 103 lb 9.6 oz (46.993 kg)  03/26/15 103 lb 9.6 oz (46.993 kg)  10/26/14 103 lb (46.72 kg)    Physical Exam  Constitutional: She appears well-developed and well-nourished. No distress.  HENT:  Head: Normocephalic and atraumatic.  Right Ear: Hearing normal. Tympanic membrane is erythematous and bulging.  Left Ear: Hearing normal. Tympanic membrane is not erythematous and not bulging. A middle ear effusion is present.  Nose: Mucosal edema and rhinorrhea  present. No sinus tenderness. Right sinus exhibits no maxillary sinus tenderness and no frontal sinus tenderness. Left sinus exhibits no maxillary sinus tenderness and no frontal sinus tenderness.  Mouth/Throat: Uvula is midline and mucous membranes are normal. No uvula swelling. No posterior oropharyngeal edema or posterior oropharyngeal erythema.  Neck: Normal range of motion. Neck supple. No Brudzinski's sign and no Kernig's sign noted.  Cardiovascular: Normal rate, regular rhythm and normal heart  sounds.   Pulmonary/Chest: Breath sounds normal. No accessory muscle usage. No tachypnea. No respiratory distress.  Lymphadenopathy:    She has cervical adenopathy.       Right cervical: Superficial cervical adenopathy present.       Left cervical: Superficial cervical adenopathy present.   Results for orders placed or performed in visit on 11/26/15  POCT rapid strep A  Result Value Ref Range   Rapid Strep A Screen Negative Negative  POCT Influenza A/B  Result Value Ref Range   Influenza A, POC Negative Negative   Influenza B, POC Negative Negative      Assessment & Plan:   Problem List Items Addressed This Visit      Respiratory   Allergic rhinitis due to pollen    Continue Zyrtec and flonase.       Relevant Medications   fluticasone (FLONASE) 50 MCG/ACT nasal spray    Other Visit Diagnoses    Sore throat    -  Primary    Likely viral. Rapid strep negative. Salt water gargles PRN for throat pain. Return if not improving.     Relevant Orders    POCT rapid strep A (Completed)    Viral syndrome        Symptoms likely 2/2 virus or sequela for ear infection. Alarm symptoms and supportive care reviewed.     Relevant Orders    POCT Influenza A/B (Completed)    Acute suppurative otitis media of right ear without spontaneous rupture of tympanic membrane, recurrence not specified        Treat with Augmentin BID for 7 days. PRN tylenol for pain and discomfort. Alarm symptoms reviewed.     Relevant Medications    amoxicillin-clavulanate (AUGMENTIN) 875-125 MG tablet       Meds ordered this encounter  Medications  . amoxicillin-clavulanate (AUGMENTIN) 875-125 MG tablet    Sig: Take 1 tablet by mouth 2 (two) times daily.    Dispense:  14 tablet    Refill:  0    Order Specific Question:  Supervising Provider    Answer:  Arlis Porta [phone]  . fluticasone (FLONASE) 50 MCG/ACT nasal spray    Sig: Place 2 sprays into both nostrils daily.    Dispense:  16 g    Refill:   11    Order Specific Question:  Supervising Provider    Answer:  Arlis Porta [phone]      Follow up plan: Return if symptoms worsen or fail to improve.

## 2015-11-26 NOTE — Patient Instructions (Signed)
I think your symptoms are viral related. Your flu and strep were negative. However we will treat for ear infection on your R side. Take Augmentin twice daily for 7 days.  Use tylenol as needed. I would continue taking Zyrtec once daily and start flonase for allergy symptoms.  Please seek immediate medical attention if you develop shortness of breath not relieve by inhaler, chest pain/tightness, fever > 103 F or other concerning symptoms.

## 2015-11-26 NOTE — Assessment & Plan Note (Signed)
Continue Zyrtec and flonase.

## 2016-01-14 ENCOUNTER — Other Ambulatory Visit: Payer: Self-pay | Admitting: Family Medicine

## 2016-01-14 ENCOUNTER — Ambulatory Visit (INDEPENDENT_AMBULATORY_CARE_PROVIDER_SITE_OTHER): Payer: 59 | Admitting: Family Medicine

## 2016-01-14 ENCOUNTER — Encounter: Payer: Self-pay | Admitting: Family Medicine

## 2016-01-14 VITALS — BP 113/80 | HR 79 | Temp 98.2°F | Resp 16 | Ht 60.0 in | Wt 102.0 lb

## 2016-01-14 DIAGNOSIS — J4521 Mild intermittent asthma with (acute) exacerbation: Secondary | ICD-10-CM

## 2016-01-14 DIAGNOSIS — J4520 Mild intermittent asthma, uncomplicated: Secondary | ICD-10-CM | POA: Insufficient documentation

## 2016-01-14 DIAGNOSIS — J301 Allergic rhinitis due to pollen: Secondary | ICD-10-CM | POA: Diagnosis not present

## 2016-01-14 MED ORDER — PREDNISONE 20 MG PO TABS: 40.0000 mg | ORAL_TABLET | Freq: Every day | ORAL | Status: DC

## 2016-01-14 MED ORDER — IPRATROPIUM-ALBUTEROL 0.5-2.5 (3) MG/3ML IN SOLN
3.0000 mL | Freq: Once | RESPIRATORY_TRACT | Status: AC
  Administered 2016-01-14: 3 mL via RESPIRATORY_TRACT

## 2016-01-14 MED ORDER — BENZONATATE 100 MG PO CAPS: 100.0000 mg | ORAL_CAPSULE | Freq: Three times a day (TID) | ORAL | Status: DC | PRN

## 2016-01-14 MED ORDER — IPRATROPIUM-ALBUTEROL 0.5-2.5 (3) MG/3ML IN SOLN: 3.0000 mL | Freq: Four times a day (QID) | RESPIRATORY_TRACT | Status: DC

## 2016-01-14 MED ORDER — DM-GUAIFENESIN ER 30-600 MG PO TB12: 1.0000 | ORAL_TABLET | Freq: Two times a day (BID) | ORAL | Status: DC

## 2016-01-14 NOTE — Patient Instructions (Signed)
We are treating an asthma exacerbation today. I think it is related to allergies. Do you nebulizer at home as often as you need but no more than every 4-6 hours. Use albuterol inhaler as needed. I want you to take prednisone 40mg  once daily for 5 days to help ease your breathing.   Salt water gargles 4 times daily for sore throat.   Please seek immediate medical attention if you develop shortness of breath not relieve by inhaler, chest pain/tightness, fever > 103 F or other concerning symptoms.

## 2016-01-14 NOTE — Assessment & Plan Note (Signed)
Likely allergic mediated asthma exacerbation. Continue zyrtec and flonase to help with symptoms.

## 2016-01-14 NOTE — Progress Notes (Signed)
Subjective:    Patient ID: Meredith Carpenter, female    DOB: 05-Dec-1970, 45 y.o.   MRN: [id number]  HPI: Meredith Carpenter is a 45 y.o. female presenting on 01/14/2016 for Cough   HPI  Pt presents for cough and throat irriation x1 week. Worsening over the weekend. Cough is productive if green/yellow sputum. History is significant for asthma. No fevers. Using inhaler more frequently- used albuterol x 3.  Woke up 3 am with cough- chest tightness. Normal inhaler use is only PRN. Throat is sore. Some trouble swallowing. Throat  Is most sore early morning and at bedtime. L > R.  Some rhinorrhea. Some sneezing. Sick contacts: Works in Recruitment consultant.  Home treatment: Zytrec and flonase daily.   Past Medical History  Diagnosis Date  . Asthma   . GERD (gastroesophageal reflux disease)   . Hyperlipidemia   . Vitamin D deficiency     Current Outpatient Prescriptions on File Prior to Visit  Medication Sig  . acyclovir (ZOVIRAX) 400 MG tablet Take 1 tablet (400 mg total) by mouth 5 (five) times daily.  Marland Kitchen albuterol (PROVENTIL HFA;VENTOLIN HFA) 108 (90 BASE) MCG/ACT inhaler Inhale 1 puff into the lungs every 6 (six) hours as needed for wheezing or shortness of breath.  . calcium-vitamin D (OSCAL WITH D) 500-200 MG-UNIT per tablet Take 1 tablet by mouth.  . CRESTOR 5 MG tablet TAKE 1 TABLET (5 MG TOTAL) BY MOUTH DAILY.  Marland Kitchen esomeprazole (NEXIUM) 40 MG capsule Take 1 capsule (40 mg total) by mouth every morning.  . ferrous sulfate 325 (65 FE) MG tablet Take 1 tablet (325 mg total) by mouth 2 (two) times daily.  . fluticasone (FLONASE) 50 MCG/ACT nasal spray Place 2 sprays into both nostrils daily.  . mometasone (ELOCON) 0.1 % cream Apply 1 application topically daily.  . Multiple Vitamin (MULTIVITAMIN) capsule Take 1 capsule by mouth daily.  . pantoprazole (PROTONIX) 40 MG tablet Take 1 tablet (40 mg total) by mouth daily. (Patient not taking: Reported on 01/14/2016)  . ranitidine (ZANTAC) 150 MG tablet  Take 1 tablet (150 mg total) by mouth 2 (two) times daily. (Patient not taking: Reported on 01/14/2016)   No current facility-administered medications on file prior to visit.    Review of Systems  Constitutional: Negative for fever and chills.  HENT: Positive for postnasal drip, rhinorrhea and sore throat. Negative for congestion and ear pain.   Respiratory: Positive for cough and chest tightness. Negative for wheezing.   Cardiovascular: Negative for chest pain and leg swelling.  Gastrointestinal: Negative for nausea, vomiting, abdominal pain, diarrhea and constipation.  Endocrine: Negative.  Negative for cold intolerance, heat intolerance, polydipsia, polyphagia and polyuria.  Genitourinary: Negative for dysuria and difficulty urinating.  Musculoskeletal: Negative.  Negative for neck pain and neck stiffness.  Neurological: Negative for dizziness, light-headedness and numbness.  Psychiatric/Behavioral: Negative.    Per HPI unless specifically indicated above     Objective:    BP 113/80 mmHg  Pulse 79  Temp(Src) 98.2 F (36.8 C)  Resp 16  Ht 5' (1.524 m)  Wt 102 lb (46.267 kg)  BMI 19.92 kg/m2  SpO2 99%  LMP 12/31/2015 (Approximate)  Wt Readings from Last 3 Encounters:  01/14/16 102 lb (46.267 kg)  11/26/15 103 lb 9.6 oz (46.993 kg)  03/26/15 103 lb 9.6 oz (46.993 kg)    Physical Exam  Constitutional: She is oriented to person, place, and time. She appears well-developed and well-nourished. She appears ill. No distress.  HENT:  Head: Normocephalic and atraumatic.  Right Ear: Hearing and tympanic membrane normal.  Left Ear: Hearing and tympanic membrane normal.  Nose: Mucosal edema and rhinorrhea present. Right sinus exhibits no maxillary sinus tenderness and no frontal sinus tenderness. Left sinus exhibits no maxillary sinus tenderness and no frontal sinus tenderness.  Mouth/Throat: Uvula is midline and mucous membranes are normal. Posterior oropharyngeal erythema (mild.)  present.  Neck: Normal range of motion. No erythema present. No Brudzinski's sign and no Kernig's sign noted.  Cardiovascular: Normal rate and regular rhythm.  Exam reveals no gallop and no friction rub.   No murmur heard. Pulmonary/Chest: Effort normal. No respiratory distress. She has no decreased breath sounds. She has wheezes (with coughing.) in the right lower field and the left lower field. She has no rhonchi. She has no rales. Chest wall is not dull to percussion. She exhibits no tenderness.  Lymphadenopathy:    She has no cervical adenopathy.  Neurological: She is alert and oriented to person, place, and time.  Skin: Skin is warm and dry. No rash noted. She is not diaphoretic. No erythema. No pallor.  Psychiatric: She has a normal mood and affect. Her speech is normal and behavior is normal.   Results for orders placed or performed in visit on 11/26/15  POCT rapid strep A  Result Value Ref Range   Rapid Strep A Screen Negative Negative  POCT Influenza A/B  Result Value Ref Range   Influenza A, POC Negative Negative   Influenza B, POC Negative Negative      Assessment & Plan:   Problem List Items Addressed This Visit      Respiratory   Allergic rhinitis due to pollen - Primary    Likely allergic mediated asthma exacerbation. Continue zyrtec and flonase to help with symptoms.       Mild intermittent asthma    Treat for asthma exacerbation. 40mg  prednisone once daily for 5 days. Continue albuterol nebulizers PRN and albuterol inhaler.  No need for abx at this time.  Alarm symptoms reviewed.  Return if not improving.       Relevant Medications   predniSONE (DELTASONE) 20 MG tablet   ipratropium-albuterol (DUONEB) 0.5-2.5 (3) MG/3ML nebulizer solution 3 mL (Completed)   benzonatate (TESSALON) 100 MG capsule   dextromethorphan-guaiFENesin (MUCINEX DM) 30-600 MG 12hr tablet      Meds ordered this encounter  Medications  . DISCONTD: ipratropium-albuterol (DUONEB) 0.5-2.5  (3) MG/3ML nebulizer solution 3 mL    Sig:   . predniSONE (DELTASONE) 20 MG tablet    Sig: Take 2 tablets (40 mg total) by mouth daily with breakfast. For 5 days.    Dispense:  10 tablet    Refill:  0    Order Specific Question:  Supervising Provider    Answer:  Arlis Porta [phone]  . ipratropium-albuterol (DUONEB) 0.5-2.5 (3) MG/3ML nebulizer solution 3 mL    Sig:   . benzonatate (TESSALON) 100 MG capsule    Sig: Take 1 capsule (100 mg total) by mouth 3 (three) times daily as needed.    Dispense:  30 capsule    Refill:  0    Order Specific Question:  Supervising Provider    Answer:  Arlis Porta [phone]  . dextromethorphan-guaiFENesin (MUCINEX DM) 30-600 MG 12hr tablet    Sig: Take 1 tablet by mouth 2 (two) times daily.    Dispense:  20 tablet    Refill:  0    Order Specific Question:  Supervising Provider    Answer:  Arlis Porta [phone]      Follow up plan: Return if symptoms worsen or fail to improve.

## 2016-01-14 NOTE — Assessment & Plan Note (Signed)
Treat for asthma exacerbation. 40mg  prednisone once daily for 5 days. Continue albuterol nebulizers PRN and albuterol inhaler.  No need for abx at this time.  Alarm symptoms reviewed.  Return if not improving.

## 2016-01-17 ENCOUNTER — Other Ambulatory Visit: Payer: Self-pay

## 2016-01-21 ENCOUNTER — Encounter: Payer: Self-pay | Admitting: *Deleted

## 2016-01-24 NOTE — Discharge Instructions (Signed)

## 2016-01-25 ENCOUNTER — Ambulatory Visit
Admission: RE | Admit: 2016-01-25 | Discharge: 2016-01-25 | Disposition: A | Discharge status: Home / Self Care | Payer: 59 | Source: Ambulatory Visit | Attending: Gastroenterology | Admitting: Gastroenterology

## 2016-01-25 ENCOUNTER — Encounter
Admission: RE | Disposition: A | Discharge status: Home / Self Care | Payer: Self-pay | Source: Ambulatory Visit | Attending: Gastroenterology

## 2016-01-25 ENCOUNTER — Ambulatory Visit: Payer: 59 | Admitting: Anesthesiology

## 2016-01-25 DIAGNOSIS — E559 Vitamin D deficiency, unspecified: Secondary | ICD-10-CM | POA: Diagnosis not present

## 2016-01-25 DIAGNOSIS — Z833 Family history of diabetes mellitus: Secondary | ICD-10-CM | POA: Diagnosis not present

## 2016-01-25 DIAGNOSIS — Z79810 Long term (current) use of selective estrogen receptor modulators (SERMs): Secondary | ICD-10-CM | POA: Insufficient documentation

## 2016-01-25 DIAGNOSIS — J45909 Unspecified asthma, uncomplicated: Secondary | ICD-10-CM | POA: Insufficient documentation

## 2016-01-25 DIAGNOSIS — K2970 Gastritis, unspecified, without bleeding: Secondary | ICD-10-CM | POA: Insufficient documentation

## 2016-01-25 DIAGNOSIS — R12 Heartburn: Secondary | ICD-10-CM | POA: Diagnosis not present

## 2016-01-25 DIAGNOSIS — Z79899 Other long term (current) drug therapy: Secondary | ICD-10-CM | POA: Insufficient documentation

## 2016-01-25 DIAGNOSIS — K2930 Chronic superficial gastritis without bleeding: Secondary | ICD-10-CM | POA: Diagnosis not present

## 2016-01-25 DIAGNOSIS — K219 Gastro-esophageal reflux disease without esophagitis: Secondary | ICD-10-CM | POA: Diagnosis not present

## 2016-01-25 DIAGNOSIS — Z8249 Family history of ischemic heart disease and other diseases of the circulatory system: Secondary | ICD-10-CM | POA: Insufficient documentation

## 2016-01-25 DIAGNOSIS — E785 Hyperlipidemia, unspecified: Secondary | ICD-10-CM | POA: Insufficient documentation

## 2016-01-25 DIAGNOSIS — K2950 Unspecified chronic gastritis without bleeding: Secondary | ICD-10-CM | POA: Insufficient documentation

## 2016-01-25 HISTORY — PX: ESOPHAGOGASTRODUODENOSCOPY (EGD) WITH PROPOFOL: SHX5813

## 2016-01-25 SURGERY — ESOPHAGOGASTRODUODENOSCOPY (EGD) WITH PROPOFOL
Anesthesia: Monitor Anesthesia Care

## 2016-01-25 MED ORDER — LACTATED RINGERS IV SOLN
INTRAVENOUS | Status: DC
  Administered 2016-01-25: 09:00:00 via INTRAVENOUS

## 2016-01-25 MED ORDER — LIDOCAINE HCL (CARDIAC) 20 MG/ML IV SOLN
INTRAVENOUS | Status: DC | PRN
  Administered 2016-01-25: 30 mg via INTRAVENOUS

## 2016-01-25 MED ORDER — GLYCOPYRROLATE 0.2 MG/ML IJ SOLN
INTRAMUSCULAR | Status: DC | PRN
  Administered 2016-01-25: 0.1 mg via INTRAVENOUS

## 2016-01-25 MED ORDER — PROPOFOL 10 MG/ML IV BOLUS
INTRAVENOUS | Status: DC | PRN
  Administered 2016-01-25: 150 mg via INTRAVENOUS

## 2016-01-25 MED ORDER — STERILE WATER FOR IRRIGATION IR SOLN
Status: DC | PRN
  Administered 2016-01-25: 10:00:00

## 2016-01-25 SURGICAL SUPPLY — 31 items
BALLN DILATOR 10-12 8 (BALLOONS)
BALLN DILATOR 12-15 8 (BALLOONS)
BALLN DILATOR 15-18 8 (BALLOONS)
BALLN DILATOR CRE 0-12 8 (BALLOONS)
BALLN DILATOR ESOPH 8 10 CRE (MISCELLANEOUS) IMPLANT
BALLOON DILATOR 12-15 8 (BALLOONS) IMPLANT
BALLOON DILATOR 15-18 8 (BALLOONS) IMPLANT
BALLOON DILATOR CRE 0-12 8 (BALLOONS) IMPLANT
BLOCK BITE 60FR ADLT L/F GRN (MISCELLANEOUS) ×3 IMPLANT
CANISTER SUCT 1200ML W/VALVE (MISCELLANEOUS) ×3 IMPLANT
CLIP HMST 235XBRD CATH ROT (MISCELLANEOUS) IMPLANT
CLIP RESOLUTION 360 11X235 (MISCELLANEOUS)
FCP ESCP3.2XJMB 240X2.8X (MISCELLANEOUS) ×1
FORCEPS BIOP RAD 4 LRG CAP 4 (CUTTING FORCEPS) IMPLANT
FORCEPS BIOP RJ4 240 W/NDL (MISCELLANEOUS) ×2
FORCEPS ESCP3.2XJMB 240X2.8X (MISCELLANEOUS) ×1 IMPLANT
GOWN CVR UNV OPN BCK APRN NK (MISCELLANEOUS) ×2 IMPLANT
GOWN ISOL THUMB LOOP REG UNIV (MISCELLANEOUS) ×4
INJECTOR VARIJECT VIN23 (MISCELLANEOUS) IMPLANT
KIT DEFENDO VALVE AND CONN (KITS) IMPLANT
KIT ENDO PROCEDURE OLY (KITS) ×3 IMPLANT
MARKER SPOT ENDO TATTOO 5ML (MISCELLANEOUS) IMPLANT
PAD GROUND ADULT SPLIT (MISCELLANEOUS) IMPLANT
SNARE SHORT THROW 13M SML OVAL (MISCELLANEOUS) IMPLANT
SNARE SHORT THROW 30M LRG OVAL (MISCELLANEOUS) IMPLANT
SPOT EX ENDOSCOPIC TATTOO (MISCELLANEOUS)
SYR INFLATION 60ML (SYRINGE) IMPLANT
TRAP ETRAP POLY (MISCELLANEOUS) IMPLANT
VARIJECT INJECTOR VIN23 (MISCELLANEOUS)
WATER STERILE IRR 250ML POUR (IV SOLUTION) ×3 IMPLANT
WIRE CRE 18-20MM 8CM F G (MISCELLANEOUS) IMPLANT

## 2016-01-25 NOTE — Transfer of Care (Signed)
Immediate Anesthesia Transfer of Care Note  Patient: Meredith Carpenter  Procedure(s) Performed: Procedure(s): ESOPHAGOGASTRODUODENOSCOPY (EGD) WITH PROPOFOL (N/A)  Patient Location: PACU  Anesthesia Type: MAC  Level of Consciousness: awake, alert  and patient cooperative  Airway and Oxygen Therapy: Patient Spontanous Breathing and Patient connected to supplemental oxygen  Post-op Assessment: Post-op Vital signs reviewed, Patient's Cardiovascular Status Stable, Respiratory Function Stable, Patent Airway and No signs of Nausea or vomiting  Post-op Vital Signs: Reviewed and stable  Complications: No apparent anesthesia complications

## 2016-01-25 NOTE — Op Note (Signed)
[facility] Gastroenterology Patient Name: Meredith Carpenter Procedure Date: 01/25/2016 10:17 AM MRN: [id number] Account #: [id number] Date of Birth: [birth year] Admit Type: Outpatient Age: 45 Room: [facility] OR ROOM 01 Gender: Female Note Status: Finalized Procedure:            Upper GI endoscopy Indications:          Heartburn Providers:            Lucilla Lame, MD Referring MD:         Leata Mouse (Referring MD) Medicines:            Propofol per Anesthesia Complications:        No immediate complications. Procedure:            Pre-Anesthesia Assessment:                       - Prior to the procedure, a History and Physical was                        performed, and patient medications and allergies were                        reviewed. The patient's tolerance of previous                        anesthesia was also reviewed. The risks and benefits of                        the procedure and the sedation options and risks were                        discussed with the patient. All questions were                        answered, and informed consent was obtained. Prior                        Anticoagulants: The patient has taken no previous                        anticoagulant or antiplatelet agents. ASA Grade                        Assessment: II - A patient with mild systemic disease.                        After reviewing the risks and benefits, the patient was                        deemed in satisfactory condition to undergo the                        procedure.                       After obtaining informed consent, the endoscope was                        passed under direct vision. Throughout the procedure,  the patient's blood pressure, pulse, and oxygen                        saturations were monitored continuously. The Olympus                        GIF-HQ190 Endoscope (S#. [phone]) was introduced                        through the mouth,  and advanced to the second part of                        duodenum. The upper GI endoscopy was accomplished                        without difficulty. The patient tolerated the procedure                        well. Findings:      The examined esophagus was normal.      Localized mild inflammation characterized by erythema was found in the       gastric antrum. Biopsies were taken with a cold forceps for histology.      The examined duodenum was normal. Impression:           - Normal esophagus.                       - Gastritis. Biopsied.                       - Normal examined duodenum. Recommendation:       - Await pathology results. Procedure Code(s):    --- Professional ---                       [phone], Esophagogastroduodenoscopy, flexible, transoral;                        with biopsy, single or multiple Diagnosis Code(s):    --- Professional ---                       R12, Heartburn                       K29.70, Gastritis, unspecified, without bleeding CPT copyright 2016 American Medical Association. All rights reserved. The codes documented in this report are preliminary and upon coder review may  be revised to meet current compliance requirements. Lucilla Lame, MD 01/25/2016 10:26:47 AM This report has been signed electronically. Number of Addenda: 0 Note Initiated On: 01/25/2016 10:17 AM Total Procedure Duration: 0 hours 1 minute 49 seconds       [facility]

## 2016-01-25 NOTE — Anesthesia Preprocedure Evaluation (Signed)
Anesthesia Evaluation  Patient identified by MRN, date of birth, ID band Patient awake    Reviewed: Allergy & Precautions, NPO status , Patient's Chart, lab work & pertinent test results  Airway Mallampati: I  TM Distance: >3 FB Neck ROM: Full    Dental no notable dental hx.    Pulmonary asthma ,    Pulmonary exam normal        Cardiovascular negative cardio ROS Normal cardiovascular exam     Neuro/Psych negative neurological ROS  negative psych ROS   GI/Hepatic Neg liver ROS, GERD  ,  Endo/Other  negative endocrine ROS  Renal/GU negative Renal ROS     Musculoskeletal negative musculoskeletal ROS (+)   Abdominal   Peds negative pediatric ROS (+)  Hematology negative hematology ROS (+)   Anesthesia Other Findings   Reproductive/Obstetrics                             Anesthesia Physical Anesthesia Plan  ASA: II  Anesthesia Plan: MAC   Post-op Pain Management:    Induction: Intravenous  Airway Management Planned:   Additional Equipment:   Intra-op Plan:   Post-operative Plan:   Informed Consent: I have reviewed the patients History and Physical, chart, labs and discussed the procedure including the risks, benefits and alternatives for the proposed anesthesia with the patient or authorized representative who has indicated his/her understanding and acceptance.     Plan Discussed with: CRNA  Anesthesia Plan Comments:         Anesthesia Quick Evaluation

## 2016-01-25 NOTE — H&P (Signed)
[facility]  [address] [place], [place] [address] Phone: [phone] Fax : [phone]  Primary Care Physician:  Rubbie Battiest, NP Primary Gastroenterologist:  Dr. Allen Norris  Pre-Procedure History & Physical: HPI:  Meredith Carpenter is a 45 y.o. female is here for an endoscopy.   Past Medical History  Diagnosis Date  . Asthma   . GERD (gastroesophageal reflux disease)   . Hyperlipidemia   . Vitamin D deficiency     Past Surgical History  Procedure Laterality Date  . Tonsillectomy and adenoidectomy  1979  . Esophagogastroduodenoscopy  2014    Prior to Admission medications   Medication Sig Start Date End Date Taking? Authorizing Provider  acyclovir (ZOVIRAX) 400 MG tablet Take 1 tablet (400 mg total) by mouth 5 (five) times daily. 10/31/14  Yes Rubbie Battiest, NP  albuterol (PROVENTIL HFA;VENTOLIN HFA) 108 (90 BASE) MCG/ACT inhaler Inhale 1 puff into the lungs every 6 (six) hours as needed for wheezing or shortness of breath. 05/29/15  Yes Rubbie Battiest, NP  benzonatate (TESSALON) 100 MG capsule Take 1 capsule (100 mg total) by mouth 3 (three) times daily as needed. 01/14/16  Yes Amy Overton Mam, NP  calcium-vitamin D (OSCAL WITH D) 500-200 MG-UNIT per tablet Take 1 tablet by mouth.   Yes Historical Provider, MD  Docusate Calcium (STOOL SOFTENER PO) Take by mouth as needed.   Yes Historical Provider, MD  ferrous sulfate 325 (65 FE) MG tablet Take 1 tablet (325 mg total) by mouth 2 (two) times daily. 01/04/14  Yes Raquel Dagoberto Ligas, NP  fluticasone (FLONASE) 50 MCG/ACT nasal spray Place 2 sprays into both nostrils daily. 11/26/15  Yes Amy Overton Mam, NP  Multiple Vitamin (MULTIVITAMIN) capsule Take 1 capsule by mouth daily.   Yes Historical Provider, MD  ranitidine (ZANTAC) 150 MG tablet Take 1 tablet (150 mg total) by mouth 2 (two) times daily. 08/28/15  Yes Rubbie Battiest, NP  CRESTOR 5 MG tablet TAKE 1 TABLET (5 MG TOTAL) BY MOUTH DAILY. Patient not taking: Reported  on 01/21/2016 05/09/14   Raquel Dagoberto Ligas, NP  dextromethorphan-guaiFENesin (MUCINEX DM) 30-600 MG 12hr tablet Take 1 tablet by mouth 2 (two) times daily. Patient not taking: Reported on 01/21/2016 01/14/16   Amy Lauren Krebs, NP  mometasone (ELOCON) 0.1 % cream Apply 1 application topically daily. Reported on 01/25/2016    Historical Provider, MD  pantoprazole (PROTONIX) 40 MG tablet Take 1 tablet (40 mg total) by mouth daily. Patient not taking: Reported on 01/14/2016 10/26/14   Rubbie Battiest, NP    Allergies as of 01/17/2016  . (No Known Allergies)    Family History  Problem Relation Age of Onset  . Hyperlipidemia Mother   . Heart disease Mother   . Hypertension Mother   . Diabetes Mother   . Hypertension Brother     Social History   Social History  . Marital Status: Married    Spouse Name: N/A  . Number of Children: 2  . Years of Education: 16   Occupational History  . Medical Assistant    Social History Main Topics  . Smoking status: Never Smoker   . Smokeless tobacco: Not on file  . Alcohol Use: No  . Drug Use: No  . Sexual Activity:    Partners: Female    Patent examiner Protection: Condom     Comment: 1 partner    Other Topics Concern  . Not on file   Social History Narrative  Nisha grew up in [place]. She is married and has 2 children. 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 She enjoys spending time with her family and loves to cook.    Review of Systems: See HPI, otherwise negative ROS  Physical Exam: BP 111/77 mmHg  Pulse 80  Temp(Src) 97.7 F (36.5 C) (Temporal)  Ht 5' (1.524 m)  Wt 101 lb (45.813 kg)  BMI 19.73 kg/m2  SpO2 100%  LMP 12/31/2015 (Approximate) General:   Alert,  pleasant and cooperative in NAD Head:  Normocephalic and atraumatic. Neck:  Supple; no masses or thyromegaly. Lungs:  Clear throughout to auscultation.    Heart:  Regular rate and rhythm. Abdomen:  Soft, nontender and  nondistended. Normal bowel sounds, without guarding, and without rebound.   Neurologic:  Alert and  oriented x4;  grossly normal neurologically.  Impression/Plan: Meredith Carpenter is here for an endoscopy to be performed for GERD  Risks, benefits, limitations, and alternatives regarding  endoscopy have been reviewed with the patient.  Questions have been answered.  All parties agreeable.   Ollen Bowl, MD  01/25/2016, 10:09 AM

## 2016-01-25 NOTE — Anesthesia Postprocedure Evaluation (Signed)
Anesthesia Post Note  Patient: Meredith Carpenter  Procedure(s) Performed: Procedure(s) (LRB): ESOPHAGOGASTRODUODENOSCOPY (EGD) WITH PROPOFOL (N/A)  Patient location during evaluation: PACU Anesthesia Type: MAC Level of consciousness: awake and alert and oriented Pain management: pain level controlled Vital Signs Assessment: post-procedure vital signs reviewed and stable Respiratory status: spontaneous breathing and nonlabored ventilation Cardiovascular status: stable Postop Assessment: no signs of nausea or vomiting and adequate PO intake Anesthetic complications: no    Estill Batten

## 2016-01-25 NOTE — Anesthesia Procedure Notes (Signed)
Procedure Name: MAC Performed by: Teresha Hanks Pre-anesthesia Checklist: Patient identified, Emergency Drugs available, Suction available, Timeout performed and Patient being monitored Patient Re-evaluated:Patient Re-evaluated prior to inductionOxygen Delivery Method: Nasal cannula Placement Confirmation: positive ETCO2     

## 2016-01-28 ENCOUNTER — Encounter: Payer: Self-pay | Admitting: Gastroenterology

## 2016-01-29 ENCOUNTER — Encounter: Payer: Self-pay | Admitting: Gastroenterology

## 2016-02-01 ENCOUNTER — Other Ambulatory Visit: Payer: Self-pay

## 2016-02-01 DIAGNOSIS — K2100 Gastro-esophageal reflux disease with esophagitis, without bleeding: Secondary | ICD-10-CM

## 2016-02-01 MED ORDER — PANTOPRAZOLE SODIUM 40 MG PO TBEC: 40.0000 mg | DELAYED_RELEASE_TABLET | Freq: Two times a day (BID) | ORAL | Status: DC

## 2016-02-05 ENCOUNTER — Telehealth: Payer: Self-pay | Admitting: Nurse Practitioner

## 2016-02-05 NOTE — Telephone Encounter (Signed)
Dr. Gilford Rile will except as a new pt

## 2016-03-12 ENCOUNTER — Encounter: Payer: Self-pay | Admitting: Internal Medicine

## 2016-03-12 ENCOUNTER — Ambulatory Visit (INDEPENDENT_AMBULATORY_CARE_PROVIDER_SITE_OTHER): Payer: 59 | Admitting: Internal Medicine

## 2016-03-12 ENCOUNTER — Other Ambulatory Visit (HOSPITAL_COMMUNITY)
Admission: RE | Admit: 2016-03-12 | Discharge: 2016-03-12 | Disposition: A | Discharge status: Home / Self Care | Payer: 59 | Source: Ambulatory Visit | Attending: Internal Medicine | Admitting: Internal Medicine

## 2016-03-12 VITALS — BP 98/68 | HR 84 | Ht 60.0 in | Wt 104.2 lb

## 2016-03-12 DIAGNOSIS — Z0000 Encounter for general adult medical examination without abnormal findings: Secondary | ICD-10-CM | POA: Diagnosis not present

## 2016-03-12 DIAGNOSIS — Z01419 Encounter for gynecological examination (general) (routine) without abnormal findings: Secondary | ICD-10-CM | POA: Diagnosis not present

## 2016-03-12 DIAGNOSIS — R7989 Other specified abnormal findings of blood chemistry: Secondary | ICD-10-CM | POA: Diagnosis not present

## 2016-03-12 DIAGNOSIS — Z1151 Encounter for screening for human papillomavirus (HPV): Secondary | ICD-10-CM | POA: Diagnosis not present

## 2016-03-12 LAB — LIPID PANEL
CHOLESTEROL: 225 mg/dL — AB (ref 0–200)
HDL: 42.6 mg/dL (ref >39.00)
NonHDL: 182.31
TRIGLYCERIDES: 293 mg/dL — AB (ref 0.0–149.0)
Total CHOL/HDL Ratio: 5
VLDL: 58.6 mg/dL — ABNORMAL HIGH (ref 0.0–40.0)

## 2016-03-12 LAB — CBC WITH DIFFERENTIAL/PLATELET
BASOS ABS: 0 10*3/uL (ref 0.0–0.1)
BASOS PCT: 0.4 % (ref 0.0–3.0)
EOS ABS: 0.8 10*3/uL — AB (ref 0.0–0.7)
Eosinophils Relative: 10.2 % — ABNORMAL HIGH (ref 0.0–5.0)
HEMATOCRIT: 38.6 % (ref 36.0–46.0)
HEMOGLOBIN: 13 g/dL (ref 12.0–15.0)
LYMPHS PCT: 25.8 % (ref 12.0–46.0)
Lymphs Abs: 2.2 10*3/uL (ref 0.7–4.0)
MCHC: 33.8 g/dL (ref 30.0–36.0)
MCV: 87.3 fl (ref 78.0–100.0)
Monocytes Absolute: 0.5 10*3/uL (ref 0.1–1.0)
Monocytes Relative: 6 % (ref 3.0–12.0)
Neutro Abs: 4.8 10*3/uL (ref 1.4–7.7)
Neutrophils Relative %: 57.6 % (ref 43.0–77.0)
Platelets: 298 10*3/uL (ref 150.0–400.0)
RBC: 4.42 Mil/uL (ref 3.87–5.11)
RDW: 13.5 % (ref 11.5–15.5)
WBC: 8.3 10*3/uL (ref 4.0–10.5)

## 2016-03-12 LAB — COMPREHENSIVE METABOLIC PANEL
ALBUMIN: 4.2 g/dL (ref 3.5–5.2)
ALT: 22 U/L (ref 0–35)
AST: 18 U/L (ref 0–37)
Alkaline Phosphatase: 64 U/L (ref 39–117)
BILIRUBIN TOTAL: 0.3 mg/dL (ref 0.2–1.2)
BUN: 12 mg/dL (ref 6–23)
CALCIUM: 9.4 mg/dL (ref 8.4–10.5)
CO2: 28 mEq/L (ref 19–32)
CREATININE: 0.58 mg/dL (ref 0.40–1.20)
Chloride: 102 mEq/L (ref 96–112)
GFR: 120.71 mL/min (ref >60.00)
Glucose, Bld: 114 mg/dL — ABNORMAL HIGH (ref 70–99)
Potassium: 3.9 mEq/L (ref 3.5–5.1)
Sodium: 136 mEq/L (ref 135–145)
TOTAL PROTEIN: 7.2 g/dL (ref 6.0–8.3)

## 2016-03-12 LAB — TSH: TSH: 1.78 u[IU]/mL (ref 0.35–4.50)

## 2016-03-12 LAB — VITAMIN D 25 HYDROXY (VIT D DEFICIENCY, FRACTURES): VITD: 39.04 ng/mL (ref 30.00–100.00)

## 2016-03-12 LAB — HEMOGLOBIN A1C: Hgb A1c MFr Bld: 5.4 % (ref 4.6–6.5)

## 2016-03-12 LAB — LDL CHOLESTEROL, DIRECT: Direct LDL: 160 mg/dL

## 2016-03-12 MED ORDER — MOMETASONE FUROATE 0.1 % EX CREA: 1.0000 "application " | TOPICAL_CREAM | Freq: Every day | CUTANEOUS | Status: DC

## 2016-03-12 NOTE — Progress Notes (Signed)
Subjective:    Patient ID: Meredith Carpenter, female    DOB: 1971/07/05, 45 y.o.   MRN: [id number]  HPI  45YO female presents for physical exam.  Recently had EGD which showed chronic gastritis. Now back on Pantoprazole.  Stopped Crestor about 1 year ago. Had some muscle pain with this and wanted off medication.  Wt Readings from Last 3 Encounters:  03/12/16 104 lb 3.2 oz (47.265 kg)  01/25/16 101 lb (45.813 kg)  01/14/16 102 lb (46.267 kg)   BP Readings from Last 3 Encounters:  03/12/16 98/68  01/25/16 111/77  01/14/16 113/80    Past Medical History  Diagnosis Date  . Asthma   . GERD (gastroesophageal reflux disease)   . Hyperlipidemia   . Vitamin D deficiency    Family History  Problem Relation Age of Onset  . Hyperlipidemia Mother   . Heart disease Mother   . Hypertension Mother   . Diabetes Mother   . Hypertension Brother    Past Surgical History  Procedure Laterality Date  . Tonsillectomy and adenoidectomy  1979  . Esophagogastroduodenoscopy  2014  . Esophagogastroduodenoscopy (egd) with propofol N/A 01/25/2016    Procedure: ESOPHAGOGASTRODUODENOSCOPY (EGD) WITH PROPOFOL;  Surgeon: Lucilla Lame, MD;  Location: [place];  Service: Endoscopy;  Laterality: N/A;   Social History   Social History  . Marital Status: Married    Spouse Name: N/A  . Number of Children: 2  . Years of Education: 16   Occupational History  . Medical Assistant    Social History Main Topics  . Smoking status: Never Smoker   . Smokeless tobacco: None  . Alcohol Use: No  . Drug Use: No  . Sexual Activity:    Partners: Female    Patent examiner Protection: Condom     Comment: 1 partner    Other Topics Concern  . None   Social History Narrative   [name] grew up in [place]. She is married and has 2 children. 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 She enjoys spending time with her family and loves to cook.     Review of Systems  Constitutional: Negative for fever, chills, appetite change, fatigue and unexpected weight change.  HENT: Negative for congestion.   Eyes: Negative for visual disturbance.  Respiratory: Negative for cough and shortness of breath.   Cardiovascular: Negative for chest pain and leg swelling.  Gastrointestinal: Negative for nausea, vomiting, abdominal pain, diarrhea and constipation.  Genitourinary: Negative for flank pain, vaginal bleeding, vaginal discharge, menstrual problem and pelvic pain.  Musculoskeletal: Negative for myalgias and arthralgias.  Skin: Negative for color change and rash.  Hematological: Negative for adenopathy. Does not bruise/bleed easily.  Psychiatric/Behavioral: Negative for sleep disturbance and dysphoric mood. The patient is not nervous/anxious.        Objective:    BP 98/68 mmHg  Pulse 84  Ht 5' (1.524 m)  Wt 104 lb 3.2 oz (47.265 kg)  BMI 20.35 kg/m2  SpO2 99%  LMP 02/28/2016 Physical Exam  Constitutional: She is oriented to person, place, and time. She appears well-developed and well-nourished. No distress.  HENT:  Head: Normocephalic and atraumatic.  Right Ear: External ear normal.  Left Ear: External ear normal.  Nose: Nose normal.  Mouth/Throat: Oropharynx is clear and moist. No oropharyngeal exudate.  Eyes: Conjunctivae are normal. Pupils are equal, round, and reactive to light. Right eye exhibits no discharge. Left eye exhibits no discharge.  No scleral icterus.  Neck: Normal range of motion. Neck supple. No tracheal deviation present. No thyromegaly present.  Cardiovascular: Normal rate, regular rhythm, normal heart sounds and intact distal pulses.  Exam reveals no gallop and no friction rub.   No murmur heard. Pulmonary/Chest: Effort normal and breath sounds normal. No respiratory distress. She has no wheezes. She has no rales. She exhibits no tenderness.  Abdominal: Soft. Bowel sounds are normal. She exhibits no distension  and no mass. There is no tenderness. There is no rebound and no guarding.  Genitourinary: Rectum normal, vagina normal and uterus normal. No breast swelling, tenderness, discharge or bleeding. Pelvic exam was performed with patient supine. There is no rash, tenderness or lesion on the right labia. There is no rash, tenderness or lesion on the left labia. Uterus is not enlarged and not tender. Cervix exhibits no motion tenderness, no discharge and no friability. Right adnexum displays no mass, no tenderness and no fullness. Left adnexum displays no mass, no tenderness and no fullness. No erythema or tenderness in the vagina. No vaginal discharge found.  Musculoskeletal: Normal range of motion. She exhibits no edema or tenderness.  Lymphadenopathy:    She has no cervical adenopathy.  Neurological: She is alert and oriented to person, place, and time. No cranial nerve deficit. She exhibits normal muscle tone. Coordination normal.  Skin: Skin is warm and dry. No rash noted. She is not diaphoretic. No erythema. No pallor.  Psychiatric: She has a normal mood and affect. Her behavior is normal. Judgment and thought content normal.          Assessment & Plan:   Problem List Items Addressed This Visit      Unprioritized   Routine general medical examination at a health care facility - Primary    General medical exam normal today including breast and pelvic exam. PAP pending. Labs ordered. Encouraged healthy diet and exercise. Declines mammogram. Immunizations UTD.      Relevant Orders   CBC with Differential/Platelet   Comprehensive metabolic panel   Lipid panel   VITAMIN D 25 Hydroxy (Vit-D Deficiency, Fractures)   TSH   Hemoglobin A1c   Cytology - PAP       Return in about 1 year (around 03/12/2017) for Physical.  Ronette Deter, MD Internal Medicine [facility]

## 2016-03-12 NOTE — Patient Instructions (Signed)
Health Maintenance, Female Adopting a healthy lifestyle and getting preventive care can go a long way to promote health and wellness. Talk with your health care provider about what schedule of regular examinations is right for you. This is a good chance for you to check in with your provider about disease prevention and staying healthy. In between checkups, there are plenty of things you can do on your own. Experts have done a lot of research about which lifestyle changes and preventive measures are most likely to keep you healthy. Ask your health care provider for more information. WEIGHT AND DIET  Eat a healthy diet  Be sure to include plenty of vegetables, fruits, low-fat dairy products, and lean protein.  Do not eat a lot of foods high in solid fats, added sugars, or salt.  Get regular exercise. This is one of the most important things you can do for your health.  Most adults should exercise for at least 150 minutes each week. The exercise should increase your heart rate and make you sweat (moderate-intensity exercise).  Most adults should also do strengthening exercises at least twice a week. This is in addition to the moderate-intensity exercise.  Maintain a healthy weight  Body mass index (BMI) is a measurement that can be used to identify possible weight problems. It estimates body fat based on height and weight. Your health care provider can help determine your BMI and help you achieve or maintain a healthy weight.  For females 20 years of age and older:   A BMI below 18.5 is considered underweight.  A BMI of 18.5 to 24.9 is normal.  A BMI of 25 to 29.9 is considered overweight.  A BMI of 30 and above is considered obese.  Watch levels of cholesterol and blood lipids  You should start having your blood tested for lipids and cholesterol at 43 years of age, then have this test every 5 years.  You may need to have your cholesterol levels checked more often if:  Your lipid  or cholesterol levels are high.  You are older than 43 years of age.  You are at high risk for heart disease.  CANCER SCREENING   Lung Cancer  Lung cancer screening is recommended for adults 55-80 years old who are at high risk for lung cancer because of a history of smoking.  A yearly low-dose CT scan of the lungs is recommended for people who:  Currently smoke.  Have quit within the past 15 years.  Have at least a 30-pack-year history of smoking. A pack year is smoking an average of one pack of cigarettes a day for 1 year.  Yearly screening should continue until it has been 15 years since you quit.  Yearly screening should stop if you develop a health problem that would prevent you from having lung cancer treatment.  Breast Cancer  Practice breast self-awareness. This means understanding how your breasts normally appear and feel.  It also means doing regular breast self-exams. Let your health care provider know about any changes, no matter how small.  If you are in your 20s or 30s, you should have a clinical breast exam (CBE) by a health care provider every 1-3 years as part of a regular health exam.  If you are 40 or older, have a CBE every year. Also consider having a breast X-ray (mammogram) every year.  If you have a family history of breast cancer, talk to your health care provider about genetic screening.  If you   are at high risk for breast cancer, talk to your health care provider about having an MRI and a mammogram every year.  Breast cancer gene (BRCA) assessment is recommended for women who have family members with BRCA-related cancers. BRCA-related cancers include:  Breast.  Ovarian.  Tubal.  Peritoneal cancers.  Results of the assessment will determine the need for genetic counseling and BRCA1 and BRCA2 testing. Cervical Cancer Your health care provider may recommend that you be screened regularly for cancer of the pelvic organs (ovaries, uterus, and  vagina). This screening involves a pelvic examination, including checking for microscopic changes to the surface of your cervix (Pap test). You may be encouraged to have this screening done every 3 years, beginning at age 21.  For women ages 30-65, health care providers may recommend pelvic exams and Pap testing every 3 years, or they may recommend the Pap and pelvic exam, combined with testing for human papilloma virus (HPV), every 5 years. Some types of HPV increase your risk of cervical cancer. Testing for HPV may also be done on women of any age with unclear Pap test results.  Other health care providers may not recommend any screening for nonpregnant women who are considered low risk for pelvic cancer and who do not have symptoms. Ask your health care provider if a screening pelvic exam is right for you.  If you have had past treatment for cervical cancer or a condition that could lead to cancer, you need Pap tests and screening for cancer for at least 20 years after your treatment. If Pap tests have been discontinued, your risk factors (such as having a new sexual partner) need to be reassessed to determine if screening should resume. Some women have medical problems that increase the chance of getting cervical cancer. In these cases, your health care provider may recommend more frequent screening and Pap tests. Colorectal Cancer  This type of cancer can be detected and often prevented.  Routine colorectal cancer screening usually begins at 43 years of age and continues through 43 years of age.  Your health care provider may recommend screening at an earlier age if you have risk factors for colon cancer.  Your health care provider may also recommend using home test kits to check for hidden blood in the stool.  A small camera at the end of a tube can be used to examine your colon directly (sigmoidoscopy or colonoscopy). This is done to check for the earliest forms of colorectal  cancer.  Routine screening usually begins at age 50.  Direct examination of the colon should be repeated every 5-10 years through 43 years of age. However, you may need to be screened more often if early forms of precancerous polyps or small growths are found. Skin Cancer  Check your skin from head to toe regularly.  Tell your health care provider about any new moles or changes in moles, especially if there is a change in a mole's shape or color.  Also tell your health care provider if you have a mole that is larger than the size of a pencil eraser.  Always use sunscreen. Apply sunscreen liberally and repeatedly throughout the day.  Protect yourself by wearing long sleeves, pants, a wide-brimmed hat, and sunglasses whenever you are outside. HEART DISEASE, DIABETES, AND HIGH BLOOD PRESSURE   High blood pressure causes heart disease and increases the risk of stroke. High blood pressure is more likely to develop in:  People who have blood pressure in the high end   of the normal range (130-139/85-89 mm Hg).  People who are overweight or obese.  People who are African American.  If you are 38-23 years of age, have your blood pressure checked every 3-5 years. If you are 61 years of age or older, have your blood pressure checked every year. You should have your blood pressure measured twice--once when you are at a hospital or clinic, and once when you are not at a hospital or clinic. Record the average of the two measurements. To check your blood pressure when you are not at a hospital or clinic, you can use:  An automated blood pressure machine at a pharmacy.  A home blood pressure monitor.  If you are between 45 years and 39 years old, ask your health care provider if you should take aspirin to prevent strokes.  Have regular diabetes screenings. This involves taking a blood sample to check your fasting blood sugar level.  If you are at a normal weight and have a low risk for diabetes,  have this test once every three years after 43 years of age.  If you are overweight and have a high risk for diabetes, consider being tested at a younger age or more often. PREVENTING INFECTION  Hepatitis B  If you have a higher risk for hepatitis B, you should be screened for this virus. You are considered at high risk for hepatitis B if:  You were born in a country where hepatitis B is common. Ask your health care provider which countries are considered high risk.  Your parents were born in a high-risk country, and you have not been immunized against hepatitis B (hepatitis B vaccine).  You have HIV or AIDS.  You use needles to inject street drugs.  You live with someone who has hepatitis B.  You have had sex with someone who has hepatitis B.  You get hemodialysis treatment.  You take certain medicines for conditions, including cancer, organ transplantation, and autoimmune conditions. Hepatitis C  Blood testing is recommended for:  Everyone born from [birth year] through 1963.  Anyone with known risk factors for hepatitis C. Sexually transmitted infections (STIs)  You should be screened for sexually transmitted infections (STIs) including gonorrhea and chlamydia if:  You are sexually active and are younger than 43 years of age.  You are older than 43 years of age and your health care provider tells you that you are at risk for this type of infection.  Your sexual activity has changed since you were last screened and you are at an increased risk for chlamydia or gonorrhea. Ask your health care provider if you are at risk.  If you do not have HIV, but are at risk, it may be recommended that you take a prescription medicine daily to prevent HIV infection. This is called pre-exposure prophylaxis (PrEP). You are considered at risk if:  You are sexually active and do not regularly use condoms or know the HIV status of your partner(s).  You take drugs by injection.  You are sexually  active with a partner who has HIV. Talk with your health care provider about whether you are at high risk of being infected with HIV. If you choose to begin PrEP, you should first be tested for HIV. You should then be tested every 3 months for as long as you are taking PrEP.  PREGNANCY   If you are premenopausal and you may become pregnant, ask your health care provider about preconception counseling.  If you may  become pregnant, take 400 to 800 micrograms (mcg) of folic acid every day.  If you want to prevent pregnancy, talk to your health care provider about birth control (contraception). OSTEOPOROSIS AND MENOPAUSE   Osteoporosis is a disease in which the bones lose minerals and strength with aging. This can result in serious bone fractures. Your risk for osteoporosis can be identified using a bone density scan.  If you are 61 years of age or older, or if you are at risk for osteoporosis and fractures, ask your health care provider if you should be screened.  Ask your health care provider whether you should take a calcium or vitamin D supplement to lower your risk for osteoporosis.  Menopause may have certain physical symptoms and risks.  Hormone replacement therapy may reduce some of these symptoms and risks. Talk to your health care provider about whether hormone replacement therapy is right for you.  HOME CARE INSTRUCTIONS   Schedule regular health, dental, and eye exams.  Stay current with your immunizations.   Do not use any tobacco products including cigarettes, chewing tobacco, or electronic cigarettes.  If you are pregnant, do not drink alcohol.  If you are breastfeeding, limit how much and how often you drink alcohol.  Limit alcohol intake to no more than 1 drink per day for nonpregnant women. One drink equals 12 ounces of beer, 5 ounces of wine, or 1 ounces of hard liquor.  Do not use street drugs.  Do not share needles.  Ask your health care provider for help if  you need support or information about quitting drugs.  Tell your health care provider if you often feel depressed.  Tell your health care provider if you have ever been abused or do not feel safe at home.   This information is not intended to replace advice given to you by your health care provider. Make sure you discuss any questions you have with your health care provider.   Document Released: 03/31/2011 Document Revised: 10/06/2014 Document Reviewed: 08/17/2013 Elsevier Interactive Patient Education [employer].

## 2016-03-12 NOTE — Assessment & Plan Note (Signed)
General medical exam normal today including breast and pelvic exam. PAP pending. Labs ordered. Encouraged healthy diet and exercise. Declines mammogram. Immunizations UTD.

## 2016-03-12 NOTE — Progress Notes (Signed)
Pre visit review using our clinic review tool, if applicable. No additional management support is needed unless otherwise documented below in the visit note. 

## 2016-03-13 ENCOUNTER — Encounter: Payer: Self-pay | Admitting: Internal Medicine

## 2016-03-13 LAB — CYTOLOGY - PAP

## 2016-05-03 DIAGNOSIS — H524 Presbyopia: Secondary | ICD-10-CM | POA: Diagnosis not present

## 2016-06-06 ENCOUNTER — Other Ambulatory Visit: Payer: Self-pay | Admitting: Nurse Practitioner

## 2016-06-09 ENCOUNTER — Other Ambulatory Visit: Payer: Self-pay | Admitting: Family Medicine

## 2016-06-09 MED ORDER — RANITIDINE HCL 150 MG PO TABS: 150.0000 mg | ORAL_TABLET | Freq: Two times a day (BID) | ORAL | 5 refills | Status: DC

## 2016-07-04 ENCOUNTER — Other Ambulatory Visit: Payer: Self-pay | Admitting: Family Medicine

## 2016-07-04 DIAGNOSIS — E785 Hyperlipidemia, unspecified: Secondary | ICD-10-CM

## 2016-07-08 ENCOUNTER — Other Ambulatory Visit: Payer: 59

## 2016-07-08 DIAGNOSIS — E785 Hyperlipidemia, unspecified: Secondary | ICD-10-CM | POA: Diagnosis not present

## 2016-07-09 ENCOUNTER — Encounter: Payer: Self-pay | Admitting: Family Medicine

## 2016-07-09 ENCOUNTER — Ambulatory Visit (INDEPENDENT_AMBULATORY_CARE_PROVIDER_SITE_OTHER): Payer: 59 | Admitting: Family Medicine

## 2016-07-09 VITALS — BP 115/77 | HR 84 | Resp 16 | Ht 60.0 in | Wt 104.0 lb

## 2016-07-09 DIAGNOSIS — Z8249 Family history of ischemic heart disease and other diseases of the circulatory system: Secondary | ICD-10-CM

## 2016-07-09 DIAGNOSIS — E782 Mixed hyperlipidemia: Secondary | ICD-10-CM | POA: Diagnosis not present

## 2016-07-09 LAB — LIPID PANEL
CHOLESTEROL: 223 mg/dL — AB (ref 125–200)
HDL: 45 mg/dL — ABNORMAL LOW (ref >=46)
LDL Cholesterol: 152 mg/dL — ABNORMAL HIGH (ref <130)
TRIGLYCERIDES: 132 mg/dL (ref <150)
Total CHOL/HDL Ratio: 5 Ratio (ref <=5.0)
VLDL: 26 mg/dL (ref <30)

## 2016-07-09 NOTE — Patient Instructions (Signed)
Your risk factors for heart disease are low- therefore I don't think a medication is needed. However I would work on cutting out and saturated fats- limit cheese, low fat milk. Increase fiber and good fats (olive oil, avocado, flax seed,) You can try Red yeast rice OTC to help with cholesterol.

## 2016-07-09 NOTE — Assessment & Plan Note (Signed)
ASCVD risk score is 0.9% and Qrisk score caculated to be 2.3%- she is not in the statin benefit category.  However given her ethnicity and family history- she does have significant risk factors for heart disease. Discussed possibility of adding zetia for lipid lowering- pt declined at this time.  At this time the patient wants to pursue diet and lifestyle changes only. Have recommend increasing good fats and fiber. Add red yeast rice as a supplement- may cause myalgias.  Recheck lipids in 6 mos. Consider NMR lipo-profile at that time.

## 2016-07-09 NOTE — Progress Notes (Signed)
Subjective:    Patient ID: Meredith Carpenter, female    DOB: [birth year], 45 y.o.   MRN: [id number]  HPI: Meredith Carpenter is a 45 y.o. female presenting on 07/09/2016 for Hyperlipidemia   HPI  Pt presents for follow-up of hyperlipidemia. Family history of heart disease- uncle was age 49 when he passed from MI and mother was 69. Mom does have diabetes. She does have elevated cholesterol. Previously on crestor- caused muscle aches and pain and she stopped 1 year ago. Current ASCVD risk score is 0.9%. Cannot take fish oil because she is a vegetarian. Exercises daily. Eats oatmeal daily and lots of vegetables.   Past Medical History:  Diagnosis Date  . Asthma   . GERD (gastroesophageal reflux disease)   . Hyperlipidemia   . Vitamin D deficiency     Current Outpatient Prescriptions on File Prior to Visit  Medication Sig  . acyclovir (ZOVIRAX) 400 MG tablet Take 1 tablet (400 mg total) by mouth 5 (five) times daily.  Marland Kitchen albuterol (PROVENTIL HFA;VENTOLIN HFA) 108 (90 BASE) MCG/ACT inhaler Inhale 1 puff into the lungs every 6 (six) hours as needed for wheezing or shortness of breath.  . calcium-vitamin D (OSCAL WITH D) 500-200 MG-UNIT per tablet Take 1 tablet by mouth.  [name] Calcium (STOOL SOFTENER PO) Take by mouth as needed.  . ferrous sulfate 325 (65 FE) MG tablet Take 1 tablet (325 mg total) by mouth 2 (two) times daily.  . fluticasone (FLONASE) 50 MCG/ACT nasal spray Place 2 sprays into both nostrils daily.  . mometasone (ELOCON) 0.1 % cream Apply 1 application topically daily. Reported on 01/25/2016  . Multiple Vitamin (MULTIVITAMIN) capsule Take 1 capsule by mouth daily.  . pantoprazole (PROTONIX) 40 MG tablet Take 1 tablet (40 mg total) by mouth 2 (two) times daily.  . ranitidine (ZANTAC) 150 MG tablet Take 1 tablet (150 mg total) by mouth 2 (two) times daily.   No current facility-administered medications on file prior to visit.     Review of Systems  Constitutional:  Negative for chills and fever.  HENT: Negative.   Respiratory: Negative for cough, chest tightness and wheezing.   Cardiovascular: Negative for chest pain and leg swelling.  Gastrointestinal: Negative for abdominal pain, constipation, diarrhea, nausea and vomiting.  Endocrine: Negative.  Negative for cold intolerance, heat intolerance, polydipsia, polyphagia and polyuria.  Genitourinary: Negative for difficulty urinating and dysuria.  Musculoskeletal: Negative.   Neurological: Negative for dizziness, light-headedness and numbness.  Psychiatric/Behavioral: Negative.    Per HPI unless specifically indicated above     Objective:    BP 115/77 (BP Location: Right Arm, Patient Position: Sitting, Cuff Size: Normal)   Pulse 84   Resp 16   Ht 5' (1.524 m)   Wt 104 lb (47.2 kg)   LMP 06/27/2016   BMI 20.31 kg/m   Wt Readings from Last 3 Encounters:  07/09/16 104 lb (47.2 kg)  03/12/16 104 lb 3.2 oz (47.3 kg)  01/25/16 101 lb (45.8 kg)    Physical Exam  Constitutional: She is oriented to person, place, and time. She appears well-developed and well-nourished.  HENT:  Head: Normocephalic and atraumatic.  Neck: Neck supple.  Cardiovascular: Normal rate, regular rhythm and normal heart sounds.  Exam reveals no gallop and no friction rub.   No murmur heard. Pulmonary/Chest: Effort normal and breath sounds normal. She has no wheezes. She exhibits no tenderness.  Abdominal: Soft. Normal appearance and bowel sounds are normal. She exhibits no distension  and no mass. There is no tenderness. There is no rebound and no guarding.  Musculoskeletal: Normal range of motion. She exhibits no edema or tenderness.  Lymphadenopathy:    She has no cervical adenopathy.  Neurological: She is alert and oriented to person, place, and time.  Skin: Skin is warm and dry.   Results for orders placed or performed in visit on 07/04/16  Lipid Profile  Result Value Ref Range   Cholesterol 223 (H) 125 - 200 mg/dL     Triglycerides 132 <150 mg/dL   HDL 45 (L) >=46 mg/dL   Total CHOL/HDL Ratio 5.0 <=5.0 Ratio   VLDL 26 <30 mg/dL   LDL Cholesterol 152 (H) <130 mg/dL      Assessment & Plan:   Problem List Items Addressed This Visit      Other   HLD (hyperlipidemia) - Primary    ASCVD risk score is 0.9% and Qrisk score caculated to be 2.3%- she is not in the statin benefit category.  However given her ethnicity and family history- she does have significant risk factors for heart disease. Discussed possibility of adding zetia for lipid lowering- pt declined at this time.  At this time the patient wants to pursue diet and lifestyle changes only. Have recommend increasing good fats and fiber. Add red yeast rice as a supplement- may cause myalgias.  Recheck lipids in 6 mos. Consider NMR lipo-profile at that time.       Family history of heart disease in female family member before age 4    Given her family history and [place] descent- she does have risk factors for heart disease. Discussed possibility of a cardiology consult to help with risk factor reduction. Pt declined at this time. Would just like regular monitoring of lipids.        Other Visit Diagnoses   None.     No orders of the defined types were placed in this encounter.     Follow up plan: Return in about 6 months (around 01/07/2017), or if symptoms worsen or fail to improve, for Cholesterol .

## 2016-07-09 NOTE — Assessment & Plan Note (Addendum)
Given her family history and [place] descent- she does have risk factors for heart disease. Discussed possibility of a cardiology consult to help with risk factor reduction. Pt declined at this time. Would just like regular monitoring of lipids.

## 2016-08-13 ENCOUNTER — Other Ambulatory Visit: Payer: Self-pay | Admitting: Family Medicine

## 2016-08-13 DIAGNOSIS — J4520 Mild intermittent asthma, uncomplicated: Secondary | ICD-10-CM

## 2016-08-13 MED ORDER — ALBUTEROL SULFATE HFA 108 (90 BASE) MCG/ACT IN AERS: 1.0000 | INHALATION_SPRAY | Freq: Four times a day (QID) | RESPIRATORY_TRACT | 3 refills | Status: DC | PRN

## 2016-09-01 ENCOUNTER — Other Ambulatory Visit: Payer: Self-pay | Admitting: Family Medicine

## 2016-09-01 DIAGNOSIS — Z1239 Encounter for other screening for malignant neoplasm of breast: Secondary | ICD-10-CM

## 2016-09-03 ENCOUNTER — Ambulatory Visit
Admission: RE | Admit: 2016-09-03 | Discharge: 2016-09-03 | Disposition: A | Discharge status: Home / Self Care | Payer: 59 | Source: Ambulatory Visit | Attending: Family Medicine | Admitting: Family Medicine

## 2016-09-03 DIAGNOSIS — Z1239 Encounter for other screening for malignant neoplasm of breast: Secondary | ICD-10-CM

## 2016-09-03 DIAGNOSIS — Z1231 Encounter for screening mammogram for malignant neoplasm of breast: Secondary | ICD-10-CM | POA: Diagnosis not present

## 2016-09-09 ENCOUNTER — Inpatient Hospital Stay
Admission: RE | Admit: 2016-09-09 | Discharge: 2016-09-09 | Disposition: A | Discharge status: Home / Self Care | Payer: Self-pay | Source: Ambulatory Visit | Attending: *Deleted | Admitting: *Deleted

## 2016-09-09 ENCOUNTER — Other Ambulatory Visit: Payer: Self-pay | Admitting: *Deleted

## 2016-09-09 DIAGNOSIS — Z9289 Personal history of other medical treatment: Secondary | ICD-10-CM

## 2016-10-27 ENCOUNTER — Other Ambulatory Visit: Payer: Self-pay | Admitting: Family Medicine

## 2016-10-27 DIAGNOSIS — K219 Gastro-esophageal reflux disease without esophagitis: Secondary | ICD-10-CM

## 2016-10-27 MED ORDER — ESOMEPRAZOLE MAGNESIUM 40 MG PO CPDR
40.0000 mg | DELAYED_RELEASE_CAPSULE | Freq: Every day | ORAL | 3 refills | Status: DC
Start: 2016-10-27 — End: 2017-10-09

## 2016-11-21 ENCOUNTER — Encounter: Payer: Self-pay | Admitting: Family Medicine

## 2016-11-21 ENCOUNTER — Ambulatory Visit (INDEPENDENT_AMBULATORY_CARE_PROVIDER_SITE_OTHER): Payer: 59 | Admitting: Family Medicine

## 2016-11-21 VITALS — BP 119/86 | HR 94 | Temp 98.4°F | Resp 16 | Ht 60.0 in | Wt 102.8 lb

## 2016-11-21 DIAGNOSIS — J209 Acute bronchitis, unspecified: Secondary | ICD-10-CM

## 2016-11-21 MED ORDER — BENZONATATE 100 MG PO CAPS: 100.0000 mg | ORAL_CAPSULE | Freq: Three times a day (TID) | ORAL | 0 refills | Status: DC | PRN

## 2016-11-21 MED ORDER — AZITHROMYCIN 250 MG PO TABS: ORAL_TABLET | ORAL | 0 refills | Status: DC

## 2016-11-21 NOTE — Progress Notes (Signed)
Subjective:    Patient ID: Meredith Carpenter, female    DOB: 12-02-1970, 46 y.o.   MRN: [id number]  Meredith Carpenter is a 46 y.o. female presenting on 11/21/2016 for Cough  Patient presents for a same day appointment.  HPI  ACUTE BRONCHITIS: - Reports new onset symptoms over past 2-3 days with initially URI sore throat to start then worsening to cough with some thicker productive green mucus, worse in mornings, then improves during day, still has cough some worsening at night. - Recent sick contact daughter with dx influenza, however has limited contact and she has completed her entire tamiflu course earlier this week, also patient's husband has similar symptoms x 2-3 days but more sore throat less cough. - Taking DayQuil/Nyquil with mild relief, tried Tessalon at home with some relief - History of asthma, does have albuterol nebulizer, used yesterday and this morning with some mild relief - Denies any fevers/chills, nausea, vomiting, ear pain or pressure, sinus pain or pressure, nasal congestion, generalized muscle aches   Social History  Substance Use Topics  . Smoking status: Never Smoker  . Smokeless tobacco: Never Used  . Alcohol use No    Review of Systems Per HPI unless specifically indicated above     Objective:    BP 119/86 (BP Location: Left Arm, Cuff Size: Normal)   Pulse 94   Temp 98.4 F (36.9 C) (Oral)   Resp 16   Ht 5' (1.524 m)   Wt 102 lb 12.8 oz (46.6 kg)   BMI 20.08 kg/m   Wt Readings from Last 3 Encounters:  11/21/16 102 lb 12.8 oz (46.6 kg)  07/09/16 104 lb (47.2 kg)  03/12/16 104 lb 3.2 oz (47.3 kg)    Physical Exam  Constitutional: She appears well-developed and well-nourished. No distress.  Well-appearing, comfortable, cooperative  HENT:  Head: Normocephalic and atraumatic.  Mouth/Throat: Oropharynx is clear and moist.  Frontal / maxillary sinuses non-tender. Nares mostly patent with mild congestion without purulence. Bilateral TMs clear  without erythema, effusion or bulging. Oropharynx clear without erythema, exudates, edema or asymmetry.  Eyes: Conjunctivae are normal. Right eye exhibits no discharge. Left eye exhibits no discharge.  Neck: Normal range of motion. Neck supple.  Cardiovascular: Normal rate, regular rhythm, normal heart sounds and intact distal pulses.   No murmur heard. Pulmonary/Chest: Effort normal and breath sounds normal. No respiratory distress. She has no wheezes. She has no rales.  Good air movement. Speaks full sentences.  Lymphadenopathy:    She has no cervical adenopathy.  Neurological: She is alert.  Skin: Skin is warm and dry. No rash noted. She is not diaphoretic.  Psychiatric: Her behavior is normal.  Nursing note and vitals reviewed.      Assessment & Plan:   Problem List Items Addressed This Visit    None    Visit Diagnoses    Acute bronchitis, unspecified organism    -  Primary  Consistent with some mild worsening bronchitis in setting of likely viral URI (+sick contacts) - Afebrile, no focal signs of infection (not consistent with pneumonia by history or exam), no evidence sinusitis. PMH asthma but no wheezing or coarse sounds on exam.  Plan: 1. Reassurance, continue supportive measures, however given prior history and with asthma, with weekend approaching, sent precautionary rx to start within 24-48 hours if worsening, Azithromycin Z-pak dosing 500mg  then 250mg  daily x 4 days 2. Start Tessalon Perls take 1 capsule up to 3 times a day as needed for  cough 3. Start Mucinex-DM up to 7 days 4. Future may try anti-histamine / flonase if nasal congestion or residual post nasal drainage 5. Return criteria reviewed, follow-up within 1-2 week if not improved     Relevant Medications   benzonatate (TESSALON) 100 MG capsule   azithromycin (ZITHROMAX Z-PAK) 250 MG tablet      Meds ordered this encounter  Medications  . benzonatate (TESSALON) 100 MG capsule    Sig: Take 1 capsule (100  mg total) by mouth 3 (three) times daily as needed for cough.    Dispense:  30 capsule    Refill:  0  . azithromycin (ZITHROMAX Z-PAK) 250 MG tablet    Sig: Take 2 tabs (500mg  total) on Day 1. Take 1 tab (250mg ) daily for next 4 days.    Dispense:  6 tablet    Refill:  0      Follow up plan: Return in about 2 weeks (around 12/05/2016), or if symptoms worsen or fail to improve, for bronchitis.  [name], [facility] 11/21/2016, 11:46 AM

## 2016-11-21 NOTE — Patient Instructions (Signed)
Thank you for coming in to clinic today.  1. 1. It sounds like you had an Upper Respiratory Virus or Allergies that cause sinus drainage that has settled into a Bronchitis, lower respiratory tract infection. I don't have concerns for pneumonia today, and think that this should gradually improve. Once you are feeling better, the cough may take a few weeks to fully resolve. - If not improved 24-48 hours then start Azithromycin Z pak (antibiotic) 2 tabs day 1, then 1 tab x 4 days, complete entire course even if improved - Start Tessalon Perls take 1 capsule up to 3 times a day as needed for cough - Start Mucinex-DM OTC up to 7 days - Drink plenty of fluids to improve congestion - You may try over the counter Nasal Saline spray (Simply Saline, Ocean Spray) as needed to reduce congestion. - May take Tylenol / Motrin  If your symptoms seem to worsen instead of improve over next several days, including significant fever / chills, worsening shortness of breath, worsening wheezing, or nausea / vomiting and can't take medicines - return sooner or go to hospital Emergency Department for more immediate treatment.  Please schedule a follow-up appointment with Dr. Parks Ranger in 1-2 weeks as needed if worsening bronchitis  If you have any other questions or concerns, please feel free to call the clinic or send a message through [place] [facility]. You may also schedule an earlier appointment if necessary.  [name], DO [place]

## 2017-02-21 DIAGNOSIS — H524 Presbyopia: Secondary | ICD-10-CM | POA: Diagnosis not present

## 2017-04-07 ENCOUNTER — Other Ambulatory Visit: Payer: Self-pay | Admitting: Family Medicine

## 2017-04-07 DIAGNOSIS — D649 Anemia, unspecified: Secondary | ICD-10-CM

## 2017-04-07 DIAGNOSIS — R7309 Other abnormal glucose: Secondary | ICD-10-CM

## 2017-04-07 DIAGNOSIS — E559 Vitamin D deficiency, unspecified: Secondary | ICD-10-CM

## 2017-04-07 DIAGNOSIS — E782 Mixed hyperlipidemia: Secondary | ICD-10-CM

## 2017-04-07 DIAGNOSIS — R55 Syncope and collapse: Secondary | ICD-10-CM

## 2017-04-07 DIAGNOSIS — Z0000 Encounter for general adult medical examination without abnormal findings: Secondary | ICD-10-CM

## 2017-04-13 ENCOUNTER — Other Ambulatory Visit: Payer: Self-pay

## 2017-04-16 ENCOUNTER — Other Ambulatory Visit: Payer: 59

## 2017-04-16 DIAGNOSIS — E782 Mixed hyperlipidemia: Secondary | ICD-10-CM

## 2017-04-16 DIAGNOSIS — D649 Anemia, unspecified: Secondary | ICD-10-CM

## 2017-04-16 DIAGNOSIS — R7309 Other abnormal glucose: Secondary | ICD-10-CM | POA: Diagnosis not present

## 2017-04-16 DIAGNOSIS — E559 Vitamin D deficiency, unspecified: Secondary | ICD-10-CM | POA: Diagnosis not present

## 2017-04-16 DIAGNOSIS — R55 Syncope and collapse: Secondary | ICD-10-CM

## 2017-04-16 DIAGNOSIS — Z0000 Encounter for general adult medical examination without abnormal findings: Secondary | ICD-10-CM | POA: Diagnosis not present

## 2017-04-16 LAB — CBC WITH DIFFERENTIAL/PLATELET
BASOS PCT: 0 %
Basophils Absolute: 0 cells/uL (ref 0–200)
EOS PCT: 4 %
Eosinophils Absolute: 288 cells/uL (ref 15–500)
HCT: 40.8 % (ref 35.0–45.0)
Hemoglobin: 13.3 g/dL (ref 11.7–15.5)
LYMPHS PCT: 21 %
Lymphs Abs: 1512 cells/uL (ref 850–3900)
MCH: 29.7 pg (ref 27.0–33.0)
MCHC: 32.6 g/dL (ref 32.0–36.0)
MCV: 91.1 fL (ref 80.0–100.0)
MONOS PCT: 4 %
MPV: 10.1 fL (ref 7.5–12.5)
Monocytes Absolute: 288 cells/uL (ref 200–950)
NEUTROS PCT: 71 %
Neutro Abs: 5112 cells/uL (ref 1500–7800)
PLATELETS: 279 10*3/uL (ref 140–400)
RBC: 4.48 MIL/uL (ref 3.80–5.10)
RDW: 14.3 % (ref 11.0–15.0)
WBC: 7.2 10*3/uL (ref 3.8–10.8)

## 2017-04-16 LAB — TSH: TSH: 2.4 mIU/L

## 2017-04-17 ENCOUNTER — Other Ambulatory Visit: Payer: Self-pay

## 2017-04-17 LAB — LIPID PANEL
CHOL/HDL RATIO: 4.8 ratio (ref <5.0)
Cholesterol: 225 mg/dL — ABNORMAL HIGH (ref <200)
HDL: 47 mg/dL — AB (ref >50)
LDL CALC: 149 mg/dL — AB (ref <100)
Triglycerides: 147 mg/dL (ref <150)
VLDL: 29 mg/dL (ref <30)

## 2017-04-17 LAB — COMPLETE METABOLIC PANEL WITH GFR
ALBUMIN: 4 g/dL (ref 3.6–5.1)
ALK PHOS: 61 U/L (ref 33–115)
ALT: 17 U/L (ref 6–29)
AST: 16 U/L (ref 10–30)
BUN: 8 mg/dL (ref 7–25)
CO2: 21 mmol/L (ref 20–31)
Calcium: 9.3 mg/dL (ref 8.6–10.2)
Chloride: 103 mmol/L (ref 98–110)
Creat: 0.68 mg/dL (ref 0.50–1.10)
GFR, Est African American: >89 mL/min (ref >=60)
GFR, Est Non African American: >89 mL/min (ref >=60)
GLUCOSE: 89 mg/dL (ref 65–99)
POTASSIUM: 4.4 mmol/L (ref 3.5–5.3)
SODIUM: 138 mmol/L (ref 135–146)
Total Bilirubin: 0.4 mg/dL (ref 0.2–1.2)
Total Protein: 6.8 g/dL (ref 6.1–8.1)

## 2017-04-17 LAB — HEMOGLOBIN A1C
Hgb A1c MFr Bld: 5.4 % (ref <5.7)
MEAN PLASMA GLUCOSE: 108 mg/dL

## 2017-04-17 LAB — VITAMIN D 25 HYDROXY (VIT D DEFICIENCY, FRACTURES): VIT D 25 HYDROXY: 41 ng/mL (ref 30–100)

## 2017-04-21 ENCOUNTER — Ambulatory Visit (INDEPENDENT_AMBULATORY_CARE_PROVIDER_SITE_OTHER): Payer: 59 | Admitting: Family Medicine

## 2017-04-21 ENCOUNTER — Encounter: Payer: Self-pay | Admitting: Family Medicine

## 2017-04-21 VITALS — BP 124/72 | HR 82 | Temp 97.4°F | Resp 16 | Ht 60.0 in | Wt 103.6 lb

## 2017-04-21 DIAGNOSIS — E559 Vitamin D deficiency, unspecified: Secondary | ICD-10-CM

## 2017-04-21 DIAGNOSIS — K219 Gastro-esophageal reflux disease without esophagitis: Secondary | ICD-10-CM | POA: Diagnosis not present

## 2017-04-21 DIAGNOSIS — K2930 Chronic superficial gastritis without bleeding: Secondary | ICD-10-CM | POA: Diagnosis not present

## 2017-04-21 DIAGNOSIS — E782 Mixed hyperlipidemia: Secondary | ICD-10-CM | POA: Diagnosis not present

## 2017-04-21 DIAGNOSIS — Z0000 Encounter for general adult medical examination without abnormal findings: Secondary | ICD-10-CM | POA: Diagnosis not present

## 2017-04-21 DIAGNOSIS — R7309 Other abnormal glucose: Secondary | ICD-10-CM

## 2017-04-21 DIAGNOSIS — D649 Anemia, unspecified: Secondary | ICD-10-CM

## 2017-04-21 DIAGNOSIS — B001 Herpesviral vesicular dermatitis: Secondary | ICD-10-CM

## 2017-04-21 MED ORDER — FERROUS SULFATE 325 (65 FE) MG PO TABS: 325.0000 mg | ORAL_TABLET | Freq: Every day | ORAL | 5 refills | Status: AC

## 2017-04-21 MED ORDER — VITAMIN D-3 25 MCG (1000 UT) PO CAPS: 1000.0000 [IU] | ORAL_CAPSULE | Freq: Every day | ORAL | Status: AC

## 2017-04-21 NOTE — Patient Instructions (Addendum)
Thank you for coming to the clinic today.  1.  Double check multi vitamin for iron content (Fe = iron, 65 for once daily, is what you are taking now) - Maybe can STOP iron supplement and take a multivitamin that includes some iron - (can stop stool softener) - Also maybe can stop Vitamin D3 1,000 if your MVI has vitamin D (600-800iu daily)  2. Continue Nexium 40mg  daily - you have enough rx through January 2019  3. For cholesterol - keep trying to improve diet and exercise - If we need to in future, can consider Zetia or even Repatha  If you want we can do a Advanced Lipid Testing next year extra $40  Please schedule a Follow-up Appointment to: Return in about 1 year (around 04/21/2018) for Annual Physical.  If you have any other questions or concerns, please feel free to call the clinic or send a message through [place]. You may also schedule an earlier appointment if necessary.  Additionally, you may be receiving a survey about your experience at our clinic within a few days to 1 week by e-mail or mail. We value your feedback.  [name], DO [place]

## 2017-04-21 NOTE — Progress Notes (Signed)
Subjective:    Patient ID: Meredith Carpenter, female    DOB: 1971-02-13, 46 y.o.   MRN: [id number]  Meredith Carpenter is a 46 y.o. female presenting on 04/21/2017 for Annual Exam   HPI   Here for Annual Physical and to review lab results.  History of Vitamin D Deficiency - Currently Vitamin D3 1,000 daily maintenance, last Vit D level was normal. Not taking calcium. Taking MVI unsure contents.  History of Abnormal Glucose Reports no concerns, prior A1c highest 5.8 x 1 then normal since. Last A1c normal 5.4 CBGs: not checking Meds: Never on - Admits family history of DM Denies hypoglycemia, polyuria, visual changes, numbness or tingling.  HYPERLIPIDEMIA: - Reports concerns with genetic component to elevated cholesterol and concern with family history of cardiovascular disease with MI in mother, father, uncle (age 80) and grandparents. Last lipid panel 03/2017, abnormal with elevated LDL and mild low HDL, essentially unchanged from 06/2016 - In past (approx 6-7 years ago) tried Crestor 1m daily - intolerance to myalgias, then switched to intermittent x 1-2 days week only with still myalgias. Has considered Zetia, never tried, was unaware of Repatha Lifestyle: - Diet (eats vegetarian, limits carbs and portion, drinks water mostly, some 2% milk)  - Exercise (weekday M-F treadmill 15 min during lunch)  GERD with chronic gastritis Chronic problem, on PPI since 2007 since pregnancy, initially was on protonix > then switched to Nexium, unable to come off of this due to refractory symptoms off. 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  Anemia, iron deficiency -  History 2006-07 dx with iron deficiency anemia, has history of anemia in pregnancy as well. Thought some nutritional, no other clear cause. Improved with iron supplement. Still taking oral iron ferrous sulfate 325m76mily, unsure of iron in MVI - Has had normal CBCs for >2 years now - Asymptomatic  Additionally history of vasovagal near syncopal episode due to witnessing any injury or harm to family member with emotional stress. No recurrences, asymptomatic.  Health Maintenance: - UTD vaccines - UTD Pap smear 02/2016, negative, unsure if HPV co testing, she plans to establish with [facility]  Past Medical History:  Diagnosis Date  . Asthma   . GERD (gastroesophageal reflux disease)   . Hyperlipidemia   . Vitamin D deficiency    Past Surgical History:  Procedure Laterality Date  . ESOPHAGOGASTRODUODENOSCOPY  2014  . ESOPHAGOGASTRODUODENOSCOPY (EGD) WITH PROPOFOL N/A 01/25/2016   Procedure: ESOPHAGOGASTRODUODENOSCOPY (EGD) WITH PROPOFOL;  Surgeon: DarrLucilla Lame;  [place];  Laterality: N/A;  . TONSILLECTOMY AND ADENOIDECTOMY  1979   Social History   Social History  . Marital status: Married    Spouse name: N/A  . Number of children: 2  . Years of education: 16   Occupational History  . [employer] Main Topics  . Smoking status: Never Smoker  . Smokeless tobacco: Never Used  . Alcohol use No  . Drug use: No  . Sexual activity: Yes    Partners: Male    Birth control/ protection: Condom     Comment: 1  partner    Other Topics Concern  . Not on file   Social History Narrative   Meredith Carpenter grew up in [place]. She is married and has 2 children. 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 She enjoys spending time with her family and loves to cook.   Family History  Problem Relation Age of Onset  . Hyperlipidemia Mother   . Heart  disease Mother   . Hypertension Mother   . Diabetes Mother   . Heart attack Mother   . Heart attack Father   . Hypertension Brother   . Heart attack Maternal Uncle    Current Outpatient Prescriptions on File Prior to Visit  Medication Sig  . Docusate Calcium (STOOL SOFTENER PO) Take by mouth as needed.  Marland Kitchen esomeprazole (NEXIUM) 40 MG capsule Take 1 capsule (40 mg total) by mouth daily before breakfast.  . Multiple Vitamin (MULTIVITAMIN) capsule Take 1 capsule by mouth daily.  Marland Kitchen albuterol (PROVENTIL HFA;VENTOLIN HFA) 108 (90 Base) MCG/ACT inhaler Inhale 1-2 puffs into the lungs every 6 (six) hours as needed for wheezing or shortness of breath. (Patient not taking: Reported on 04/21/2017)  . fluticasone (FLONASE) 50 MCG/ACT nasal spray Place 2 sprays into both nostrils daily. (Patient not taking: Reported on 04/21/2017)  . mometasone (ELOCON) 0.1 % cream Apply 1 application topically daily. Reported on 01/25/2016 (Patient not taking: Reported on 04/21/2017)   No current facility-administered medications on file prior to visit.     Review of Systems  Constitutional: Negative for activity change, appetite change, chills, diaphoresis, fatigue and fever.  HENT: Negative for congestion, hearing loss and sinus pressure.   Eyes: Negative for visual disturbance.  Respiratory: Negative for apnea, cough, chest tightness, shortness of breath and wheezing.   Cardiovascular: Negative for chest pain, palpitations and leg swelling.  Gastrointestinal: Negative for abdominal pain, anal bleeding, blood in stool, constipation, diarrhea, nausea and vomiting.  Endocrine: Negative for cold intolerance and polyuria.  Genitourinary: Negative for difficulty urinating, dysuria, frequency, hematuria, menstrual problem, pelvic pain and vaginal pain.  Musculoskeletal: Negative for arthralgias, back pain and neck pain.  Skin: Negative for rash.  Allergic/Immunologic: Negative for environmental allergies.  Neurological:  Negative for dizziness, weakness, light-headedness, numbness and headaches.  Hematological: Negative for adenopathy.  Psychiatric/Behavioral: Negative for behavioral problems, decreased concentration, dysphoric mood and sleep disturbance.   Per HPI unless specifically indicated above     Objective:    BP 124/72 (BP Location: Right Arm, Patient Position: Sitting, Cuff Size: Small)   Pulse 82   Temp (!) 97.4 F (36.3 C) (Oral)   Resp 16   Ht 5' (1.524 m)   Wt 103 lb 9.6 oz (47 kg)   BMI 20.23 kg/m   Wt Readings from Last 3 Encounters:  04/21/17 103 lb 9.6 oz (47 kg)  11/21/16 102 lb 12.8 oz (46.6 kg)  07/09/16 104 lb (47.2 kg)    Physical Exam  Constitutional: She is oriented to person, place, and time. She appears well-developed and well-nourished. No distress.  Well-appearing, comfortable, cooperative  HENT:  Head: Normocephalic and atraumatic.  Mouth/Throat: Oropharynx is clear and moist.  Eyes: Pupils are equal, round, and reactive to light. Conjunctivae and EOM are normal. Right eye exhibits no discharge. Left eye exhibits no discharge.  Neck: Normal range of motion. Neck supple. No thyromegaly present.  Cardiovascular: Normal rate, regular rhythm, normal heart sounds and intact distal pulses.   No murmur heard. Pulmonary/Chest: Effort  normal and breath sounds normal. No respiratory distress. She has no wheezes. She has no rales.  Abdominal: Soft. Bowel sounds are normal. She exhibits no distension and no mass. There is no tenderness.  Musculoskeletal: Normal range of motion. She exhibits no edema or tenderness.  Upper / Lower Extremities: - Normal muscle tone, strength bilateral upper extremities 5/5, lower extremities 5/5  Lymphadenopathy:    She has no cervical adenopathy.  Neurological: She is alert and oriented to person, place, and time.  Distal sensation intact to light touch all extremities  Skin: Skin is warm and dry. No rash noted. She is not diaphoretic. No  erythema.  Psychiatric: She has a normal mood and affect. Her behavior is normal.  Well groomed, good eye contact, normal speech and thoughts  Nursing note and vitals reviewed.  Results for orders placed or performed in visit on 04/16/17  COMPLETE METABOLIC PANEL WITH GFR  Result Value Ref Range   Sodium 138 135 - 146 mmol/L   Potassium 4.4 3.5 - 5.3 mmol/L   Chloride 103 98 - 110 mmol/L   CO2 21 20 - 31 mmol/L   Glucose, Bld 89 65 - 99 mg/dL   BUN 8 7 - 25 mg/dL   Creat 0.68 0.50 - 1.10 mg/dL   Total Bilirubin 0.4 0.2 - 1.2 mg/dL   Alkaline Phosphatase 61 33 - 115 U/L   AST 16 10 - 30 U/L   ALT 17 6 - 29 U/L   Total Protein 6.8 6.1 - 8.1 g/dL   Albumin 4.0 3.6 - 5.1 g/dL   Calcium 9.3 8.6 - 10.2 mg/dL   GFR, Est African American >89 >=60 mL/min   GFR, Est Non African American >89 >=60 mL/min  Lipid panel  Result Value Ref Range   Cholesterol 225 (H) <200 mg/dL   Triglycerides 147 <150 mg/dL   HDL 47 (L) >50 mg/dL   Total CHOL/HDL Ratio 4.8 <5.0 Ratio   VLDL 29 <30 mg/dL   LDL Cholesterol 149 (H) <100 mg/dL  CBC with Differential/Platelet  Result Value Ref Range   WBC 7.2 3.8 - 10.8 K/uL   RBC 4.48 3.80 - 5.10 MIL/uL   Hemoglobin 13.3 11.7 - 15.5 g/dL   HCT 40.8 35.0 - 45.0 %   MCV 91.1 80.0 - 100.0 fL   MCH 29.7 27.0 - 33.0 pg   MCHC 32.6 32.0 - 36.0 g/dL   RDW 14.3 11.0 - 15.0 %   Platelets 279 140 - 400 K/uL   MPV 10.1 7.5 - 12.5 fL   Neutro Abs 5,112 1,500 - 7,800 cells/uL   Lymphs Abs 1,512 850 - 3,900 cells/uL   Monocytes Absolute 288 200 - 950 cells/uL   Eosinophils Absolute 288 15 - 500 cells/uL   Basophils Absolute 0 0 - 200 cells/uL   Neutrophils Relative % 71 %   Lymphocytes Relative 21 %   Monocytes Relative 4 %   Eosinophils Relative 4 %   Basophils Relative 0 %   Smear Review Criteria for review not met   Hemoglobin A1c  Result Value Ref Range   Hgb A1c MFr Bld 5.4 <5.7 %   Mean Plasma Glucose 108 mg/dL  VITAMIN D 25 Hydroxy (Vit-D Deficiency,  Fractures)  Result Value Ref Range   Vit D, 25-Hydroxy 41 30 - 100 ng/mL  TSH  Result Value Ref Range   TSH 2.40 mIU/L      Assessment & Plan:   Problem List Items Addressed This Visit  Vitamin D deficiency    Stable, controlled now on daily supplement Check home dosage, may stop Vitamin D3 1,000 iu if has MVI with Vitamin D 600-800iu daily instead Monitor yearly VIt D for now      Relevant Medications   Cholecalciferol (VITAMIN D-3) 1000 units CAPS   Recurrent cold sores    Stable now without recent flare Usually x1 yearly, resolves with Acyclovir PRN      Relevant Medications   acyclovir (ZOVIRAX) 400 MG tablet   HLD (hyperlipidemia)    Mild uncontrolled cholesterol with improved lifestyle Last lipid panel 03/2017 Calculated ASCVD 10 yr risk score 0.9% Failed Crestor myalgias (even 49m intermittent dosing)  Plan: 1. Discussion on ASCVD risk reduction given her family history but currently does not meet criteria for re-try statin. However will continue to monitor ASCVD risk 2. Offered future Advanced Lipid Panel with Inflammation markers - concern with fam history, may actually benefit alternative med such as Zetia vs Repatha, consider in future 3. No Meredith daily - concern with gastritis, also at age [age]not indicated with low ASCVD, consider >age 66 4. Encourage improved lifestyle - low carb/cholesterol, reduce portion size, continue improving regular exercise 5. Follow-up 1 year lipids      GERD (gastroesophageal reflux disease)    Stable chronic GERD, controlled on PPI Etiology initially in pregnancy, then persistent No red flag criteria Has complication with chronic gastritis on EGD 12/2015 (per [facility] Dr WAllen Norris, biopsy unremarkable Continue Nexium 443mdaily for now - discussed taper off hypersecretory caution, reviewed risk of anemia/vit d and concerns if chronic PPI use, may follow-up with GI again in future if refractory symptoms otherwise agree to keep PPI       Chronic gastritis    Stable chronic gastritis, controlled on PPI No known complication with bleeding, but has had history of IDA Last EGD 12/2015 (per [facility] Dr WoAllen Norris biopsy unremarkable Continue Nexium 4087maily for now - discussed taper off hypersecretory caution, reviewed risk of anemia/vit d and concerns if chronic PPI use, may follow-up with GI again in future if refractory symptoms otherwise agree to keep PPI      Absolute anemia    Controlled / resolved normocytic anemia secondary to iron deficiency, likely nutritional and secondary with chronic gastritis  Plan: 1. Discussed may do trial off ferrous sulfate 325m35mily, check MVI to see if has iron, may take MVI only if contains iron 2. Follow-up future CBC trend to see if needs iron      Relevant Medications   ferrous sulfate 325 (65 FE) MG tablet   Abnormal glucose    Currently normal, with A1c 5.4 Prior history elevated A1c 5.8 Not diagnosed with Pre-DM Reassurance, continue lifestyle mods Follow-up       Other Visit Diagnoses    Annual physical exam    -  Primary      Meds ordered this encounter  Medications  . Cholecalciferol (VITAMIN D-3) 1000 units CAPS    Sig: Take 1 capsule (1,000 Units total) by mouth daily.    Dispense:  60 capsule  . ferrous sulfate 325 (65 FE) MG tablet    Sig: Take 1 tablet (325 mg total) by mouth daily with breakfast.    Dispense:  60 tablet    Refill:  5  . acyclovir (ZOVIRAX) 400 MG tablet    Sig: Take 1 tablet (400 mg total) by mouth 5 (five) times daily. As needed    Dispense:  35 tablet  Refill:  0    Follow up plan: Return in about 1 year (around 04/21/2018) for Annual Physical.  [name], DO [facility] 04/22/2017, 1:04 AM

## 2017-04-22 ENCOUNTER — Encounter: Payer: Self-pay | Admitting: Family Medicine

## 2017-04-22 DIAGNOSIS — B001 Herpesviral vesicular dermatitis: Secondary | ICD-10-CM | POA: Insufficient documentation

## 2017-04-22 DIAGNOSIS — R7309 Other abnormal glucose: Secondary | ICD-10-CM | POA: Insufficient documentation

## 2017-04-22 NOTE — Assessment & Plan Note (Signed)
Stable chronic gastritis, controlled on PPI No known complication with bleeding, but has had history of IDA Last EGD 12/2015 (per [facility] Dr Allen Norris), biopsy unremarkable Continue Nexium 40mg  daily for now - discussed taper off hypersecretory caution, reviewed risk of anemia/vit d and concerns if chronic PPI use, may follow-up with GI again in future if refractory symptoms otherwise agree to keep PPI

## 2017-04-22 NOTE — Assessment & Plan Note (Signed)
Stable, controlled now on daily supplement Check home dosage, may stop Vitamin D3 1,000 iu if has MVI with Vitamin D 600-800iu daily instead Monitor yearly VIt D for now

## 2017-04-22 NOTE — Assessment & Plan Note (Signed)
Controlled / resolved normocytic anemia secondary to iron deficiency, likely nutritional and secondary with chronic gastritis  Plan: 1. Discussed may do trial off ferrous sulfate 325mg  daily, check MVI to see if has iron, may take MVI only if contains iron 2. Follow-up future CBC trend to see if needs iron

## 2017-04-22 NOTE — Assessment & Plan Note (Signed)
Stable now without recent flare Usually x1 yearly, resolves with Acyclovir PRN

## 2017-04-22 NOTE — Assessment & Plan Note (Signed)
Currently normal, with A1c 5.4 Prior history elevated A1c 5.8 Not diagnosed with Pre-DM Reassurance, continue lifestyle mods Follow-up

## 2017-04-22 NOTE — Assessment & Plan Note (Signed)
Stable chronic GERD, controlled on PPI Etiology initially in pregnancy, then persistent No red flag criteria Has complication with chronic gastritis on EGD 12/2015 (per [facility] Dr Allen Norris), biopsy unremarkable Continue Nexium 40mg  daily for now - discussed taper off hypersecretory caution, reviewed risk of anemia/vit d and concerns if chronic PPI use, may follow-up with GI again in future if refractory symptoms otherwise agree to keep PPI

## 2017-04-22 NOTE — Assessment & Plan Note (Signed)
Mild uncontrolled cholesterol with improved lifestyle Last lipid panel 03/2017 Calculated ASCVD 10 yr risk score 0.9% Failed Crestor myalgias (even 5mg  intermittent dosing)  Plan: 1. Discussion on ASCVD risk reduction given her family history but currently does not meet criteria for re-try statin. However will continue to monitor ASCVD risk 2. Offered future Advanced Lipid Panel with Inflammation markers - concern with fam history, may actually benefit alternative med such as Zetia vs Repatha, consider in future 3. No ASA daily - concern with gastritis, also at age 18 not indicated with low ASCVD, consider >age 61 4. Encourage improved lifestyle - low carb/cholesterol, reduce portion size, continue improving regular exercise 5. Follow-up 1 year lipids

## 2017-06-18 ENCOUNTER — Other Ambulatory Visit: Payer: Self-pay

## 2017-07-02 ENCOUNTER — Other Ambulatory Visit: Payer: Self-pay | Admitting: Family Medicine

## 2017-07-02 DIAGNOSIS — S46811A Strain of other muscles, fascia and tendons at shoulder and upper arm level, right arm, initial encounter: Secondary | ICD-10-CM

## 2017-07-02 MED ORDER — BACLOFEN 10 MG PO TABS: 5.0000 mg | ORAL_TABLET | Freq: Three times a day (TID) | ORAL | 1 refills | Status: DC | PRN

## 2017-07-06 ENCOUNTER — Ambulatory Visit: Payer: Self-pay | Admitting: Family

## 2017-07-06 VITALS — BP 100/80 | HR 90 | Temp 98.6°F

## 2017-07-06 DIAGNOSIS — J069 Acute upper respiratory infection, unspecified: Secondary | ICD-10-CM

## 2017-07-06 NOTE — Progress Notes (Signed)
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 Here today for 2-3 day history of sore throat, rhinorrhea, low-grade fever, malaise and right ear pressure/popping. She reports a history of seasonal allergies and asthma.  O/ vital signs stable alert pleasant nasal congestion appreciated ENT: TMs very retracted and dull, nasal mucosa erythematous and swollen, sinuses non-TTP, pharynx-clear, neck supple, heart RSR, lungs-clear  A/: Upper respiratory infection , hx of allergies and asthma  P/:Supportive measures discussed. Follow up prn not improving    allergy tips reviewed and encouraged  viral course discussed and supportive measures encouraged.

## 2017-07-10 ENCOUNTER — Telehealth: Payer: Self-pay | Admitting: Family Medicine

## 2017-07-10 DIAGNOSIS — L509 Urticaria, unspecified: Secondary | ICD-10-CM

## 2017-07-10 MED ORDER — PREDNISONE 20 MG PO TABS: 20.0000 mg | ORAL_TABLET | Freq: Every day | ORAL | 0 refills | Status: DC

## 2017-07-10 NOTE — Telephone Encounter (Signed)
Patient with new onset worsening hives started earlier today on face and neck and then spreading across face upper back bilateral upper extremities, itchy red hive appearance. Only potential exposure was a candle last night that, otherwise no significant triggers today, some worsening with increased stressors at work. Does not like taking Benadryl due to sedation. Will start a 2nd gen anti-histamine cetirizine / loratadine. Also would recommend a very brief dose of prednisone if dramatic worsening hives urticaria become more extensive, persistent itching, and especially if more systemic symptoms, return precautions given when to get it evaluated more urgently if involving airway dyspnea or other symptoms.  Meredith Carpenter, [facility] 07/10/2017, 3:11 PM

## 2017-07-13 ENCOUNTER — Other Ambulatory Visit: Payer: Self-pay | Admitting: Family Medicine

## 2017-07-13 DIAGNOSIS — S46819A Strain of other muscles, fascia and tendons at shoulder and upper arm level, unspecified arm, initial encounter: Secondary | ICD-10-CM | POA: Insufficient documentation

## 2017-07-13 DIAGNOSIS — M542 Cervicalgia: Secondary | ICD-10-CM | POA: Insufficient documentation

## 2017-07-13 DIAGNOSIS — S46811A Strain of other muscles, fascia and tendons at shoulder and upper arm level, right arm, initial encounter: Secondary | ICD-10-CM

## 2017-07-30 ENCOUNTER — Ambulatory Visit: Payer: 59 | Attending: Family Medicine

## 2017-07-30 DIAGNOSIS — M25511 Pain in right shoulder: Secondary | ICD-10-CM | POA: Diagnosis not present

## 2017-07-30 DIAGNOSIS — M6281 Muscle weakness (generalized): Secondary | ICD-10-CM | POA: Insufficient documentation

## 2017-07-30 DIAGNOSIS — M542 Cervicalgia: Secondary | ICD-10-CM | POA: Insufficient documentation

## 2017-07-30 NOTE — Therapy (Signed)
[place] [facility] [facility] [address], [place], [address] Phone: [phone]   Fax:  [phone]  Physical Therapy Evaluation  Patient Details  Name: Meredith Carpenter Carpenter MRN: [id number] Date of Birth: 1970/11/10 Referring Provider: [name] DO  Encounter Date: 07/30/2017      Meredith Carpenter End of Session - 07/30/17 1540    Visit Number 1   Number of Visits 12   Date for Meredith Carpenter Re-Evaluation 09/10/17   Meredith Carpenter Start Time 1430   Meredith Carpenter Stop Time 1530   Meredith Carpenter Time Calculation (min) 60 min   Activity Tolerance Patient tolerated treatment well   Behavior During Therapy [facility] for tasks assessed/performed      Past Medical History:  Diagnosis Date  . Asthma   . GERD (gastroesophageal reflux disease)   . Hyperlipidemia   . Vitamin D deficiency     Past Surgical History:  Procedure Laterality Date  . ESOPHAGOGASTRODUODENOSCOPY  2014  . ESOPHAGOGASTRODUODENOSCOPY (EGD) WITH PROPOFOL N/A 01/25/2016   Procedure: ESOPHAGOGASTRODUODENOSCOPY (EGD) WITH PROPOFOL;  Surgeon: Lucilla Lame, MD;  Location: [place];  Service: Endoscopy;  Laterality: N/A;  . TONSILLECTOMY AND ADENOIDECTOMY  1979    There were no vitals filed for this visit.       Subjective Assessment - 07/30/17 1430    Subjective Patient reports increased pain along her R upper trap in mid onset from insidious onset. Patient reports her pain intermittently radiating down the R arm (posteior aspect). Patient reports increased pain with turning her head to the R, vacuuming, and lifting. Patient reports decreased pain flexeril but has stopped taking secondary to drowziness. Patient reports her sleeping is disturbed secondary to the pain., patient reports she needs to sleep on her back to not increase the pain.    Pertinent History Acid Reflex   Limitations Lifting   Diagnostic tests None   Patient Stated Goals Decrease pain   Currently in Pain? Yes   Pain Score 4    worst: 6/10; best: 0/10   Pain Location Neck   Pain Orientation Right   Pain Descriptors / Indicators Aching;Tender   Pain Type Acute pain   Pain Onset More than a month ago   Pain Frequency Intermittent            [name] Carpenter Assessment - 07/30/17 1430      Assessment   Medical Diagnosis R neck pain   Referring Provider [name] DO   Onset Date/Surgical Date 04/29/17   Hand Dominance Right   Next MD Visit unknown   Prior Therapy  no     Balance Screen   Has the patient fallen in the past 6 months No   Has the patient had a decrease in activity level because of a fear of falling?  No   Is the patient reluctant to leave their home because of a fear of falling?  No     Prior Function   Level of Independence Independent   Vocation Full time employment   Vocation Requirements computer work   Leisure listening to music     Cognition   Overall Cognitive Status Within Functional Limits for tasks assessed     Observation/Other Assessments   Other Surveys  Other Surveys   Neck Disability Index  18%     Sensation   Light Touch Appears Intact     Posture/Postural Control   Posture/Postural Control Postural limitations   Postural Limitations Rounded Shoulders;Forward head  ROM / Strength   AROM / PROM / Strength AROM;Strength     AROM   AROM Assessment Site Cervical  All other movement WNL   Cervical Flexion 35   Cervical Extension 45   Cervical - Right Side Bend 45  pain   Cervical - Left Side Bend 20  pain   Cervical - Right Rotation 55   Cervical - Left Rotation 65     Strength   Strength Assessment Site Shoulder   Right/Left Shoulder Left;Right   Right Shoulder Flexion 4/5   Right Shoulder ABduction 4/5   Right Shoulder Internal Rotation 4/5   Right Shoulder External Rotation 4-/5   Left Shoulder Flexion 5/5   Left Shoulder Extension 5/5   Left Shoulder ABduction 5/5   Left Shoulder Internal Rotation 5/5   Left Shoulder External  Rotation 5/5   Left Shoulder Horizontal ABduction 5/5   Left Shoulder Horizontal ADduction 5/5     Palpation   Spinal mobility Unilaterally on the R: hypomobility: C3-T1;    Palpation comment TTP: over R rhomboid/mid trap and upper trap     Special Tests    Special Tests Cervical   Cervical Tests Spurling's     Spurling's   Findings Positive   Side Right   Comment Increased pain      Objective measurements completed on examination: See above findings.   TREATMENT: Scapular retraction rows with YTB -- x10 Serratus punches in supine -- x 10  Behind the back scapular retraction -- x 10  B shoulder ER -- x 10  Patient demonstrates no increase in pain throughout session       Meredith Carpenter Education - 07/30/17 1539    Education provided Yes   Education Details HEP: scapular retraction with arms behind back, scapular retraction with YTB, B shoulder external ROM   Person(s) Educated Patient   Methods Demonstration;Explanation   Comprehension Verbalized understanding;Returned demonstration             Meredith Carpenter Long Term Goals - 07/30/17 1550      Meredith Carpenter LONG TERM GOAL #1   Title Patient will be independent with HEP to continue benefits of therapy after discharge   Baseline Dependent for form/technique   Time 4   Period Weeks   Status New   Target Date 08/27/17     Meredith Carpenter LONG TERM GOAL #2   Title Patient will improve NDI to under 8% to indicated significant improvement with self-perceived cervical function such as sleeping.    Baseline 18% NDI   Time 6   Period Weeks   Status New   Target Date 09/10/17     Meredith Carpenter LONG TERM GOAL #3   Title Patient will be able to sleep on her side without increase in pain to indicate significant improvement with cervical dysfunction and promote healing to the area.    Baseline Increased pain with sleeping on her side   Time 6   Period Weeks   Status New   Target Date 09/10/17                Plan - 07/30/17 1541    Clinical Impression  Statement Patient is a 46 yo right hand dominant female presenting with increased upper trap and neck pain from insidious onset. Patient demonstrates increased shoulder/ upper trap dysfunction with possible cervical hypomobility on the affected side. Patient also demonstrates increased pain and spasms with shoulder activity with increased upper trap activity with exercises. Patient demonstrates decreased pain after cueing  to decrease shoulder elevation. Patient will benefit from further skilled therapy focused on improving motor control, endurance, and strength on the affected side. Patient will benefit from further skilled therapy to return to prior level of function.    History and Personal Factors relevant to plan of care: Patient denies PMH   Clinical Presentation Stable   Clinical Presentation due to: improvement of pain in past month   Clinical Decision Making Low   Rehab Potential Fair   Clinical Impairments Affecting Rehab Potential (+) highly motivated (-) persistance of pain   Meredith Carpenter Frequency 1x / week   Meredith Carpenter Duration 6 weeks   Meredith Carpenter Treatment/Interventions Moist Heat;Electrical Stimulation;Cryotherapy;Iontophoresis 4mg /ml Dexamethasone;Ultrasound;Therapeutic exercise;Therapeutic activities;Neuromuscular re-education;Patient/family education;Scar mobilization;Passive range of motion;Manual techniques;Dry needling   Meredith Carpenter Next Visit Plan progress AROM and coordination   Meredith Carpenter Home Exercise Plan See education   Consulted and Agree with Plan of Care Patient      Patient will benefit from skilled therapeutic intervention in order to improve the following deficits and impairments:  Increased fascial restricitons, Decreased mobility, Decreased coordination, Increased muscle spasms, Decreased strength, Decreased range of motion, Decreased endurance, Hypomobility, Decreased balance, Postural dysfunction  Visit Diagnosis: Right shoulder pain, unspecified chronicity - Plan: Meredith Carpenter plan of care  cert/re-cert  Cervicalgia - Plan: Meredith Carpenter plan of care cert/re-cert  Muscle weakness (generalized) - Plan: Meredith Carpenter plan of care cert/re-cert     Problem List Patient Active Problem List   Diagnosis Date Noted  . Neck pain on right side 07/13/2017  . Trapezius muscle strain 07/13/2017  . Abnormal glucose 04/22/2017  . Recurrent cold sores 04/22/2017  . Family history of heart disease in female family member before age 105 07/09/2016  . Chronic gastritis   . Mild intermittent asthma 01/14/2016  . Allergic rhinitis due to pollen 11/26/2015  . Absolute anemia 03/26/2015  . Hypertriglyceridemia 03/26/2015  . GERD (gastroesophageal reflux disease) 10/29/2014  . Vitamin D deficiency 01/04/2014  . HLD (hyperlipidemia) 01/04/2014  . Screening for breast cancer 01/04/2014    Meredith Carpenter Carpenter, Meredith Carpenter Carpenter 07/30/2017, 3:56 PM  [place] [facility] [address] [place], [address] Phone: [phone]   Fax:  [phone]  Name: Meredith Carpenter Carpenter MRN: [id number] Date of Birth: Apr 05, 1971

## 2017-08-03 ENCOUNTER — Ambulatory Visit: Payer: 59

## 2017-08-04 ENCOUNTER — Ambulatory Visit: Payer: 59

## 2017-08-04 DIAGNOSIS — M542 Cervicalgia: Secondary | ICD-10-CM | POA: Diagnosis not present

## 2017-08-04 DIAGNOSIS — M6281 Muscle weakness (generalized): Secondary | ICD-10-CM | POA: Diagnosis not present

## 2017-08-04 DIAGNOSIS — M25511 Pain in right shoulder: Secondary | ICD-10-CM | POA: Diagnosis not present

## 2017-08-04 NOTE — Therapy (Signed)
[place] [facility] [address], [place], [address] Phone: [phone]   Fax:  [phone]  Physical Therapy Treatment  Patient Details  Name: Meredith Carpenter MRN: [id number] Date of Birth: [birth year] Referring Provider: [name] DO   Encounter Date: 08/04/2017  PT End of Session - 08/04/17 1248    Visit Number  2    Number of Visits  12    Date for PT Re-Evaluation  09/10/17    PT Start Time  1207    PT Stop Time  1245    PT Time Calculation (min)  38 min    Activity Tolerance  Patient tolerated treatment well    Behavior During Therapy  [facility] for tasks assessed/performed       Past Medical History:  Diagnosis Date  . Asthma   . GERD (gastroesophageal reflux disease)   . Hyperlipidemia   . Vitamin D deficiency     Past Surgical History:  Procedure Laterality Date  . ESOPHAGOGASTRODUODENOSCOPY  2014  . TONSILLECTOMY AND ADENOIDECTOMY  1979    There were no vitals filed for this visit.  Subjective Assessment - 08/04/17 1209    Subjective  Patient reports improvement in symptoms and only has minimal pain now when turning her head in the car. Patient demonstrates decreased pain overall in the shoulder.     Pertinent History  Acid Reflex    Limitations  Lifting    Diagnostic tests  None    Patient Stated Goals  Decrease pain    Currently in Pain?  No/denies    Pain Onset  More than a month ago       TREATMENT: Therapeutic Exercise: Cervical retraction isometrics - x10 in supine - 5 sec holds Seated cervical isometrics side bending - x 10 - 5 sec holds B High row in sitting at [place] - 2 x 10 10# Standing Scapular retraction rows at [place] - 2 x 10 5# Standing straight arm push downs at [place] - 2 x 10 10# Seated ball roll outs with large physioball - x 15  Standing scapular/cervical retraction - 2 x 10   Manual Therapy  STM to patient cervical extensors on the R side to decrease  increased spasms and pain on the R side and allow for greater ability to turn head most notably when driving in the car. Mobilizations grade II-III on the R unilaterally C3-5 2 x 30sec to decrease pain and spasms and improve mobility. Patient demonstrates decreased pain after session  PT Education - 08/04/17 1248    Education provided  Yes    Education Details  HEP isometics cervical retraction and side bending    Person(s) Educated  Patient    Methods  Explanation;Demonstration    Comprehension  Verbalized understanding;Returned demonstration          PT Long Term Goals - 07/30/17 1550      PT LONG TERM GOAL #1   Title  Patient will be independent with HEP to continue benefits of therapy after discharge    Baseline  Dependent for form/technique    Time  4    Period  Weeks    Status  New    Target Date  08/27/17      PT LONG TERM GOAL #2   Title  Patient will improve NDI to under 8% to indicated significant improvement with self-perceived cervical function such as sleeping.     Baseline  18% NDI  Time  6    Period  Weeks    Status  New    Target Date  09/10/17      PT LONG TERM GOAL #3   Title  Patient will be able to sleep on her side without increase in pain to indicate significant improvement with cervical dysfunction and promote healing to the area.     Baseline  Increased pain with sleeping on her side    Time  6    Period  Weeks    Status  New    Target Date  09/10/17            Plan - 08/04/17 1248    Clinical Impression Statement  Patient demonstrates significant decrease in pain after performing manual therapy especially when turning her head. Patient requires manual and tactile cueing to accurately perform exercises indicating decreased muscular coordination. Patient will benefit from further skilled therapy to return to prior level of function.     Rehab Potential  Fair    Clinical Impairments Affecting Rehab Potential  (+) highly motivated (-)  persistance of pain    PT Frequency  1x / week    PT Duration  6 weeks    PT Treatment/Interventions  Moist Heat;Electrical Stimulation;Cryotherapy;Iontophoresis 4mg /ml Dexamethasone;Ultrasound;Therapeutic exercise;Therapeutic activities;Neuromuscular re-education;Patient/family education;Scar mobilization;Passive range of motion;Manual techniques;Dry needling    PT Next Visit Plan  progress AROM and coordination    PT Home Exercise Plan  See education    Consulted and Agree with Plan of Care  Patient       Patient will benefit from skilled therapeutic intervention in order to improve the following deficits and impairments:  Increased fascial restricitons, Decreased mobility, Decreased coordination, Increased muscle spasms, Decreased strength, Decreased range of motion, Decreased endurance, Hypomobility, Decreased balance, Postural dysfunction  Visit Diagnosis: Right shoulder pain, unspecified chronicity  Cervicalgia  Muscle weakness (generalized)     Problem List Patient Active Problem List   Diagnosis Date Noted  . Neck pain on right side 07/13/2017  . Trapezius muscle strain 07/13/2017  . Abnormal glucose 04/22/2017  . Recurrent cold sores 04/22/2017  . Family history of heart disease in female family member before age 44 07/09/2016  . Chronic gastritis   . Mild intermittent asthma 01/14/2016  . Allergic rhinitis due to pollen 11/26/2015  . Absolute anemia 03/26/2015  . Hypertriglyceridemia 03/26/2015  . GERD (gastroesophageal reflux disease) 10/29/2014  . Vitamin D deficiency 01/04/2014  . HLD (hyperlipidemia) 01/04/2014  . Screening for breast cancer 01/04/2014    Blythe Stanford, PT DPT 08/04/2017, 12:52 PM  [place] [facility] [address] [place], [address] Phone: [phone]   Fax:  [phone]  Name: SEVILLE BRICK MRN: [id number] Date of Birth: Jul 19, 1971

## 2017-08-12 ENCOUNTER — Ambulatory Visit: Payer: 59

## 2017-08-12 DIAGNOSIS — M25511 Pain in right shoulder: Secondary | ICD-10-CM | POA: Diagnosis not present

## 2017-08-12 DIAGNOSIS — M542 Cervicalgia: Secondary | ICD-10-CM | POA: Diagnosis not present

## 2017-08-12 DIAGNOSIS — M6281 Muscle weakness (generalized): Secondary | ICD-10-CM | POA: Diagnosis not present

## 2017-08-12 NOTE — Therapy (Signed)
[place] [facility] [address] [place], [address] Phone: [phone]   Fax:  [phone]  Physical Therapy Treatment  Patient Details  Name: Meredith Carpenter MRN: [id number] Date of Birth: August 31, 1971 Referring Provider: [name] DO   Encounter Date: 08/12/2017    Past Medical History:  Diagnosis Date  . Asthma   . GERD (gastroesophageal reflux disease)   . Hyperlipidemia   . Vitamin D deficiency     Past Surgical History:  Procedure Laterality Date  . ESOPHAGOGASTRODUODENOSCOPY  2014  . TONSILLECTOMY AND ADENOIDECTOMY  1979    There were no vitals filed for this visit.  Subjective Assessment - 08/12/17 1211    Subjective  Patient reports  she continues to have pain after the performing vacuuming. Patient reports the pain increases the next day after performing vacuuming.     Pertinent History  Acid Reflex    Limitations  Lifting    Diagnostic tests  None    Patient Stated Goals  Decrease pain    Currently in Pain?  No/denies    Pain Onset  More than a month ago       TREATMENT: Therapeutic Exercise: High row in sitting at [place] - 2 x 10 15# Standing Scapular retraction rows at [place] - 2 x 10 5# Standing straight arm push downs at [place] - 2 x 10 10# Wall angels facing the wall - x 10 Wall angels facing away from wall - 2 x 10  Push up PLUS at wall - 2 x 10  Seated ball roll outs with large physioball - x 15 TRX Rows in standing - 2 x 10   Manual therapy: STM to patient cervical extensors, UTs, and lower traps on the R side to decrease increased spasms and pain with patient positioned in sitting.   Patient reports decreased pain at the end of the session    PT Education - 08/12/17 1224    Education provided  Yes    Education Details  form/technique with exercise    Person(s) Educated  Patient    Methods  Explanation;Demonstration    Comprehension  Verbalized  understanding;Returned demonstration          PT Long Term Goals - 07/30/17 1550      PT LONG TERM GOAL #1   Title  Patient will be independent with HEP to continue benefits of therapy after discharge    Baseline  Dependent for form/technique    Time  4    Period  Weeks    Status  New    Target Date  08/27/17      PT LONG TERM GOAL #2   Title  Patient will improve NDI to under 8% to indicated significant improvement with self-perceived cervical function such as sleeping.     Baseline  18% NDI    Time  6    Period  Weeks    Status  New    Target Date  09/10/17      PT LONG TERM GOAL #3   Title  Patient will be able to sleep on her side without increase in pain to indicate significant improvement with cervical dysfunction and promote healing to the area.     Baseline  Increased pain with sleeping on her side    Time  6    Period  Weeks    Status  New    Target Date  09/10/17  Plan - 08/12/17 1234    Clinical Impression Statement  Patient demonstrates increased pain along her lower trap/ rhomboid area after performing therapeutic exercise indicating poor motor control. Patient demonstrates decreased pain after cueing to decrease shoulder elevation and patient will benefit from further skilled therapy focused on improving limitations. Patient reports she feels she can self managae symptoms at home since she only gets pain after vacuuming. Edcuated patient to continue with exercise performance and to call if symptoms increase and/or do not improve.    Rehab Potential  Fair    Clinical Impairments Affecting Rehab Potential  (+) highly motivated (-) persistance of pain    PT Frequency  1x / week    PT Duration  6 weeks    PT Treatment/Interventions  Moist Heat;Electrical Stimulation;Cryotherapy;Iontophoresis 4mg /ml Dexamethasone;Ultrasound;Therapeutic exercise;Therapeutic activities;Neuromuscular re-education;Patient/family education;Scar mobilization;Passive range of  motion;Manual techniques;Dry needling    PT Next Visit Plan  progress AROM and coordination    PT Home Exercise Plan  See education    Consulted and Agree with Plan of Care  Patient       Patient will benefit from skilled therapeutic intervention in order to improve the following deficits and impairments:  Increased fascial restricitons, Decreased mobility, Decreased coordination, Increased muscle spasms, Decreased strength, Decreased range of motion, Decreased endurance, Hypomobility, Decreased balance, Postural dysfunction  Visit Diagnosis: Cervicalgia  Right shoulder pain, unspecified chronicity  Muscle weakness (generalized)     Problem List Patient Active Problem List   Diagnosis Date Noted  . Neck pain on right side 07/13/2017  . Trapezius muscle strain 07/13/2017  . Abnormal glucose 04/22/2017  . Recurrent cold sores 04/22/2017  . Family history of heart disease in female family member before age 51 07/09/2016  . Chronic gastritis   . Mild intermittent asthma 01/14/2016  . Allergic rhinitis due to pollen 11/26/2015  . Absolute anemia 03/26/2015  . Hypertriglyceridemia 03/26/2015  . GERD (gastroesophageal reflux disease) 10/29/2014  . Vitamin D deficiency 01/04/2014  . HLD (hyperlipidemia) 01/04/2014  . Screening for breast cancer 01/04/2014    Blythe Stanford, PT DPT 08/12/2017, 1:10 PM  [place] [facility] [address] [place], [address] Phone: [phone]   Fax:  [phone]  Name: Meredith Carpenter MRN: [id number] Date of Birth: Jan 12, 1971

## 2017-08-25 ENCOUNTER — Ambulatory Visit: Payer: 59

## 2017-09-10 ENCOUNTER — Encounter: Payer: Self-pay | Admitting: Family Medicine

## 2017-09-10 DIAGNOSIS — T753XXA Motion sickness, initial encounter: Secondary | ICD-10-CM

## 2017-09-10 MED ORDER — SCOPOLAMINE 1 MG/3DAYS TD PT72: 1.0000 | MEDICATED_PATCH | TRANSDERMAL | 0 refills | Status: DC

## 2017-10-09 ENCOUNTER — Other Ambulatory Visit: Payer: Self-pay

## 2017-10-09 DIAGNOSIS — K219 Gastro-esophageal reflux disease without esophagitis: Secondary | ICD-10-CM

## 2017-10-09 MED ORDER — ESOMEPRAZOLE MAGNESIUM 40 MG PO CPDR: 40.0000 mg | DELAYED_RELEASE_CAPSULE | Freq: Every day | ORAL | 3 refills | Status: DC

## 2017-11-09 ENCOUNTER — Encounter: Payer: Self-pay | Admitting: Family Medicine

## 2017-11-09 DIAGNOSIS — N939 Abnormal uterine and vaginal bleeding, unspecified: Secondary | ICD-10-CM

## 2017-11-09 DIAGNOSIS — N920 Excessive and frequent menstruation with regular cycle: Secondary | ICD-10-CM

## 2017-11-10 ENCOUNTER — Ambulatory Visit: Payer: No Typology Code available for payment source | Admitting: Obstetrics and Gynecology

## 2017-11-10 ENCOUNTER — Encounter: Payer: Self-pay | Admitting: Obstetrics and Gynecology

## 2017-11-10 VITALS — BP 112/78 | HR 77 | Ht 60.0 in | Wt 99.1 lb

## 2017-11-10 DIAGNOSIS — Z124 Encounter for screening for malignant neoplasm of cervix: Secondary | ICD-10-CM | POA: Diagnosis not present

## 2017-11-10 DIAGNOSIS — N939 Abnormal uterine and vaginal bleeding, unspecified: Secondary | ICD-10-CM

## 2017-11-10 NOTE — Progress Notes (Signed)
PT is having abnormal cycle having 7 day cycle and spotting in between. No brown blood all bright red blood.

## 2017-11-10 NOTE — Progress Notes (Signed)
GYNECOLOGY PROGRESS NOTE  Subjective:    Patient ID: Meredith Carpenter, female    DOB: [birth year], 47 y.o.   MRN: [id number]  HPI  Patient is a 47 y.o. G35P2002 female who presents for complaints of abnormal menses since October.  She has had ~ 2 periods each month since then, cycles occurring every 15-21 days. Periods are lasting approximately 10 days. Denies cramping or passage of clots.  Denies vaginal discharge. Patient initially could not recall when her last pap smear was, however review of results shows pap smear in 02/2016 is normal.    Past Medical History:  Diagnosis Date  . Asthma   . GERD (gastroesophageal reflux disease)   . Hyperlipidemia   . Vitamin D deficiency     Family History  Problem Relation Age of Onset  . Hyperlipidemia Mother   . Heart disease Mother   . Hypertension Mother   . Diabetes Mother   . Heart attack Mother   . Heart attack Father   . Hypertension Brother   . Heart attack Maternal Uncle     Past Surgical History:  Procedure Laterality Date  . ESOPHAGOGASTRODUODENOSCOPY  2014  . ESOPHAGOGASTRODUODENOSCOPY (EGD) WITH PROPOFOL N/A 01/25/2016   Procedure: ESOPHAGOGASTRODUODENOSCOPY (EGD) WITH PROPOFOL;  Surgeon: Lucilla Lame, MD;  Location: [place];  Service: Endoscopy;  Laterality: N/A;  . TONSILLECTOMY AND ADENOIDECTOMY  1979    Social History   Socioeconomic History  . Marital status: Married    Spouse name: Not on file  . Number of children: 2  . Years of education: 13  . Highest education level: Not on file  Social Needs  . Financial resource strain: Not on file  . Food insecurity - worry: Not on file  . Food insecurity - inability: Not on file  . Transportation needs - medical: Not on file  . Transportation needs - non-medical: Not on file  Occupational History  . Occupation: Ship broker: [employer]  Tobacco Use  . Smoking status: Never Smoker  . Smokeless tobacco: Never Used    Substance and Sexual Activity  . Alcohol use: No    Alcohol/week: 0.0 oz  . Drug use: No  . Sexual activity: Yes    Partners: Male    Birth control/protection: Condom    Comment: 1 partner   Other Topics Concern  . Not on file  Social History Narrative   [name] grew up in [place]. She is married and has 2 children. 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 She enjoys spending time with her family and loves to cook.    Current Outpatient Medications on File Prior to Visit  Medication Sig Dispense Refill  . acyclovir (ZOVIRAX) 400 MG tablet Take 1 tablet (400 mg total) by mouth 5 (five) times daily. As needed 35 tablet 0  . albuterol (PROVENTIL HFA;VENTOLIN HFA) 108 (90 Base) MCG/ACT inhaler Inhale 1-2 puffs into the lungs every 6 (six) hours as needed for wheezing or shortness of breath. 18 g 3  . Cholecalciferol (VITAMIN D-3) 1000 units CAPS Take 1 capsule (1,000 Units total) by mouth daily. 60 capsule   . Docusate Calcium (STOOL SOFTENER PO) Take by mouth as needed.    Marland Kitchen esomeprazole (NEXIUM) 40 MG capsule Take 1 capsule (40 mg total) by mouth daily before breakfast. 90 capsule 3  . ferrous sulfate 325 (65 FE) MG tablet Take 1 tablet (325  mg total) by mouth daily with breakfast. 60 tablet 5  . fluticasone (FLONASE) 50 MCG/ACT nasal spray Place 2 sprays into both nostrils daily. 16 g 11  . Multiple Vitamin (MULTIVITAMIN) capsule Take 1 capsule by mouth daily.     No current facility-administered medications on file prior to visit.     No Known Allergies   Review of Systems Pertinent items noted in HPI and remainder of comprehensive ROS otherwise negative.   Objective:   Blood pressure 112/78, pulse 77, height 5' (1.524 m), weight 99 lb 1.6 oz (45 kg). Body mass index is 19.35 kg/m.  General appearance: alert and no distress Abdomen: soft, non-tender; bowel sounds normal; no masses,  no organomegaly Pelvic: external  genitalia normal, rectovaginal septum normal.  Vagina without discharge.  Cervix normal appearing, no lesions and no motion tenderness.  Uterus mobile, nontender, normal shape and size.  Adnexae non-palpable, nontender bilaterally.  Extremities: extremities normal, atraumatic, no cyanosis or edema Neurologic: Grossly normal   Assessment:   Abnormal uterine bleeding Cervical cancer screening  Plan:   Patient has abnormal uterine bleeding . She has a normal exam, no evidence of lesions. Discussed differential diagnosis for abnormal uterine bleeding. Will order abnormal uterine bleeding evaluation labs, hormonal profile, and pelvic ultrasound to evaluate for any structural gynecologic abnormalities.  Will contact patient with these results and plans for further evaluation/management.  Pap smear repeated today in light of of abnormal bleeding.    Rubie Maid, MD [facility]  .

## 2017-11-10 NOTE — Patient Instructions (Signed)

## 2017-11-13 ENCOUNTER — Other Ambulatory Visit: Payer: Self-pay | Admitting: Obstetrics and Gynecology

## 2017-11-13 ENCOUNTER — Telehealth: Payer: Self-pay | Admitting: Obstetrics and Gynecology

## 2017-11-13 LAB — CBC
HEMATOCRIT: 41.1 % (ref 35.0–45.0)
HEMOGLOBIN: 13.1 g/dL (ref 11.7–15.5)
MCH: 29 pg (ref 27.0–33.0)
MCHC: 31.9 g/dL — AB (ref 32.0–36.0)
MCV: 91.1 fL (ref 80.0–100.0)
MPV: 11.1 fL (ref 7.5–12.5)
Platelets: 335 10*3/uL (ref 140–400)
RBC: 4.51 10*6/uL (ref 3.80–5.10)
RDW: 13.9 % (ref 11.0–15.0)
WBC: 7.2 10*3/uL (ref 3.8–10.8)

## 2017-11-13 LAB — ESTRADIOL: ESTRADIOL: 112 pg/mL

## 2017-11-13 LAB — IGP, COBASHPV16/18
HPV 16: NEGATIVE
HPV 18: NEGATIVE
HPV OTHER HR TYPES: NEGATIVE
PAP SMEAR COMMENT: 0

## 2017-11-13 LAB — PROGESTERONE: Progesterone: <0.5 ng/mL

## 2017-11-13 LAB — FSH/LH
FSH: 13.4 m[IU]/mL
LH: 5.6 m[IU]/mL

## 2017-11-13 LAB — TSH: TSH: 2.3 mIU/L

## 2017-11-13 MED ORDER — MEDROXYPROGESTERONE ACETATE 10 MG PO TABS: 10.0000 mg | ORAL_TABLET | Freq: Every day | ORAL | 6 refills | Status: DC

## 2017-11-13 NOTE — Telephone Encounter (Signed)
The patient called and stated that she would like to speak with her nurse in regards to her test results and her next appointment. No other information was disclosed. Please advise.

## 2017-11-16 ENCOUNTER — Encounter: Payer: Self-pay | Admitting: Obstetrics and Gynecology

## 2017-11-16 NOTE — Telephone Encounter (Signed)
Pt was called and informed of the directions on how to use her provera and informed of her next appt.  Pt wants to speak with [facility] concerning last labs.

## 2017-11-17 ENCOUNTER — Telehealth: Payer: Self-pay | Admitting: Obstetrics and Gynecology

## 2017-11-17 NOTE — Telephone Encounter (Signed)
Sent a MyChart explaining to pt on how to take her provera.

## 2017-11-17 NOTE — Telephone Encounter (Signed)
The patient called and stated that she would like to speak with Dr. Marcelline Mates or her nurse when possible. The patient also stated that she sent a message yesterday in MyChart to Dr. Marcelline Mates and I informed the patient that she was out of the office in surgery. No other information was disclosed. Please advise.

## 2017-11-19 ENCOUNTER — Ambulatory Visit (INDEPENDENT_AMBULATORY_CARE_PROVIDER_SITE_OTHER): Payer: No Typology Code available for payment source

## 2017-11-19 DIAGNOSIS — N939 Abnormal uterine and vaginal bleeding, unspecified: Secondary | ICD-10-CM | POA: Diagnosis not present

## 2017-11-26 ENCOUNTER — Encounter: Payer: Self-pay | Admitting: Obstetrics and Gynecology

## 2018-02-04 ENCOUNTER — Other Ambulatory Visit: Payer: Self-pay | Admitting: Family Medicine

## 2018-02-04 DIAGNOSIS — J019 Acute sinusitis, unspecified: Secondary | ICD-10-CM

## 2018-02-04 MED ORDER — IPRATROPIUM BROMIDE 0.06 % NA SOLN: 2.0000 | Freq: Four times a day (QID) | NASAL | 0 refills | Status: DC

## 2018-02-05 ENCOUNTER — Encounter: Payer: Self-pay | Admitting: Obstetrics and Gynecology

## 2018-02-25 ENCOUNTER — Other Ambulatory Visit: Payer: Self-pay | Admitting: Family Medicine

## 2018-02-25 DIAGNOSIS — Z111 Encounter for screening for respiratory tuberculosis: Secondary | ICD-10-CM

## 2018-02-25 DIAGNOSIS — R7611 Nonspecific reaction to tuberculin skin test without active tuberculosis: Secondary | ICD-10-CM

## 2018-02-25 DIAGNOSIS — Z9229 Personal history of other drug therapy: Secondary | ICD-10-CM

## 2018-02-27 LAB — QUANTIFERON-TB GOLD PLUS
Mitogen-NIL: >10 IU/mL
NIL: 0.03 IU/mL
QUANTIFERON-TB GOLD PLUS: NEGATIVE
TB1-NIL: 0.16 IU/mL
TB2-NIL: 0.16 IU/mL

## 2018-04-05 ENCOUNTER — Other Ambulatory Visit: Payer: Self-pay | Admitting: Family Medicine

## 2018-04-05 DIAGNOSIS — R7309 Other abnormal glucose: Secondary | ICD-10-CM

## 2018-04-05 DIAGNOSIS — Z0000 Encounter for general adult medical examination without abnormal findings: Secondary | ICD-10-CM

## 2018-04-05 DIAGNOSIS — E559 Vitamin D deficiency, unspecified: Secondary | ICD-10-CM

## 2018-04-05 DIAGNOSIS — K219 Gastro-esophageal reflux disease without esophagitis: Secondary | ICD-10-CM

## 2018-04-05 DIAGNOSIS — Z862 Personal history of diseases of the blood and blood-forming organs and certain disorders involving the immune mechanism: Secondary | ICD-10-CM

## 2018-04-05 DIAGNOSIS — E782 Mixed hyperlipidemia: Secondary | ICD-10-CM

## 2018-04-14 ENCOUNTER — Other Ambulatory Visit: Payer: No Typology Code available for payment source

## 2018-04-14 DIAGNOSIS — Z862 Personal history of diseases of the blood and blood-forming organs and certain disorders involving the immune mechanism: Secondary | ICD-10-CM

## 2018-04-14 DIAGNOSIS — Z0000 Encounter for general adult medical examination without abnormal findings: Secondary | ICD-10-CM

## 2018-04-14 DIAGNOSIS — E782 Mixed hyperlipidemia: Secondary | ICD-10-CM

## 2018-04-14 DIAGNOSIS — R7309 Other abnormal glucose: Secondary | ICD-10-CM

## 2018-04-14 DIAGNOSIS — E559 Vitamin D deficiency, unspecified: Secondary | ICD-10-CM

## 2018-04-14 DIAGNOSIS — K219 Gastro-esophageal reflux disease without esophagitis: Secondary | ICD-10-CM

## 2018-04-15 ENCOUNTER — Encounter: Payer: Self-pay | Admitting: Family Medicine

## 2018-04-15 LAB — CBC WITH DIFFERENTIAL/PLATELET
BASOS ABS: 40 {cells}/uL (ref 0–200)
Basophils Relative: 0.5 %
EOS ABS: 328 {cells}/uL (ref 15–500)
EOS PCT: 4.1 %
HEMATOCRIT: 39.4 % (ref 35.0–45.0)
HEMOGLOBIN: 13.3 g/dL (ref 11.7–15.5)
LYMPHS ABS: 1472 {cells}/uL (ref 850–3900)
MCH: 29.9 pg (ref 27.0–33.0)
MCHC: 33.8 g/dL (ref 32.0–36.0)
MCV: 88.5 fL (ref 80.0–100.0)
MONOS PCT: 6.5 %
MPV: 10.5 fL (ref 7.5–12.5)
NEUTROS ABS: 5640 {cells}/uL (ref 1500–7800)
NEUTROS PCT: 70.5 %
PLATELETS: 287 10*3/uL (ref 140–400)
RBC: 4.45 10*6/uL (ref 3.80–5.10)
RDW: 13.7 % (ref 11.0–15.0)
Total Lymphocyte: 18.4 %
WBC mixed population: 520 cells/uL (ref 200–950)
WBC: 8 10*3/uL (ref 3.8–10.8)

## 2018-04-15 LAB — LIPID PANEL
Cholesterol: 234 mg/dL — ABNORMAL HIGH (ref <200)
HDL: 49 mg/dL — ABNORMAL LOW (ref >50)
LDL CHOLESTEROL (CALC): 159 mg/dL — AB
NON-HDL CHOLESTEROL (CALC): 185 mg/dL — AB (ref <130)
Total CHOL/HDL Ratio: 4.8 (calc) (ref <5.0)
Triglycerides: 131 mg/dL (ref <150)

## 2018-04-15 LAB — COMPLETE METABOLIC PANEL WITH GFR
AG Ratio: 1.8 (calc) (ref 1.0–2.5)
ALBUMIN MSPROF: 4.4 g/dL (ref 3.6–5.1)
ALKALINE PHOSPHATASE (APISO): 62 U/L (ref 33–115)
ALT: 14 U/L (ref 6–29)
AST: 14 U/L (ref 10–30)
BUN: 11 mg/dL (ref 7–25)
CO2: 26 mmol/L (ref 20–32)
CREATININE: 0.69 mg/dL (ref 0.50–1.10)
Calcium: 9.6 mg/dL (ref 8.6–10.2)
Chloride: 102 mmol/L (ref 98–110)
GFR, Est African American: 123 mL/min/{1.73_m2} (ref >=60)
GFR, Est Non African American: 106 mL/min/{1.73_m2} (ref >=60)
GLUCOSE: 83 mg/dL (ref 65–99)
Globulin: 2.5 g/dL (calc) (ref 1.9–3.7)
Potassium: 4.1 mmol/L (ref 3.5–5.3)
Sodium: 137 mmol/L (ref 135–146)
Total Bilirubin: 0.5 mg/dL (ref 0.2–1.2)
Total Protein: 6.9 g/dL (ref 6.1–8.1)

## 2018-04-15 LAB — HEMOGLOBIN A1C
EAG (MMOL/L): 6.3 (calc)
HEMOGLOBIN A1C: 5.6 %{Hb} (ref <5.7)
MEAN PLASMA GLUCOSE: 114 (calc)

## 2018-04-15 LAB — VITAMIN D 25 HYDROXY (VIT D DEFICIENCY, FRACTURES): Vit D, 25-Hydroxy: 32 ng/mL (ref 30–100)

## 2018-04-20 ENCOUNTER — Other Ambulatory Visit: Payer: Self-pay

## 2018-04-20 ENCOUNTER — Ambulatory Visit (INDEPENDENT_AMBULATORY_CARE_PROVIDER_SITE_OTHER): Payer: No Typology Code available for payment source | Admitting: Family Medicine

## 2018-04-20 ENCOUNTER — Encounter: Payer: Self-pay | Admitting: Family Medicine

## 2018-04-20 VITALS — BP 128/74 | HR 75 | Temp 98.0°F | Ht 60.0 in | Wt 98.4 lb

## 2018-04-20 DIAGNOSIS — Z1231 Encounter for screening mammogram for malignant neoplasm of breast: Secondary | ICD-10-CM

## 2018-04-20 DIAGNOSIS — Z1239 Encounter for other screening for malignant neoplasm of breast: Secondary | ICD-10-CM

## 2018-04-20 DIAGNOSIS — J4520 Mild intermittent asthma, uncomplicated: Secondary | ICD-10-CM

## 2018-04-20 DIAGNOSIS — N939 Abnormal uterine and vaginal bleeding, unspecified: Secondary | ICD-10-CM | POA: Insufficient documentation

## 2018-04-20 DIAGNOSIS — E782 Mixed hyperlipidemia: Secondary | ICD-10-CM | POA: Diagnosis not present

## 2018-04-20 DIAGNOSIS — R7309 Other abnormal glucose: Secondary | ICD-10-CM | POA: Diagnosis not present

## 2018-04-20 DIAGNOSIS — E559 Vitamin D deficiency, unspecified: Secondary | ICD-10-CM | POA: Diagnosis not present

## 2018-04-20 DIAGNOSIS — Z862 Personal history of diseases of the blood and blood-forming organs and certain disorders involving the immune mechanism: Secondary | ICD-10-CM | POA: Diagnosis not present

## 2018-04-20 DIAGNOSIS — Z0000 Encounter for general adult medical examination without abnormal findings: Secondary | ICD-10-CM

## 2018-04-20 DIAGNOSIS — K219 Gastro-esophageal reflux disease without esophagitis: Secondary | ICD-10-CM | POA: Diagnosis not present

## 2018-04-20 DIAGNOSIS — J301 Allergic rhinitis due to pollen: Secondary | ICD-10-CM

## 2018-04-20 MED ORDER — FLUTICASONE PROPIONATE 50 MCG/ACT NA SUSP
2.0000 | Freq: Every day | NASAL | 11 refills | Status: DC
Start: 2018-04-20 — End: 2022-08-01

## 2018-04-20 NOTE — Assessment & Plan Note (Signed)
Persistent elevated A1c 5.5 now up to 5.6 Concern fam history of diabetes  Plan:  1. Not on any therapy currently  2. Encourage improved lifestyle - low carb, low sugar diet, reduce portion size, continue improving regular exercise 3. Follow-up q 6 months for PreDM A1c

## 2018-04-20 NOTE — Progress Notes (Signed)
Subjective:    Patient ID: Meredith Carpenter, female    DOB: 04-May-1971, 47 y.o.   MRN: [id number]  Meredith Carpenter is a 47 y.o. female presenting on 04/20/2018 for Annual Exam and Hyperlipidemia   HPI   Here for Annual Physical and Lab Review  History of Vitamin D Deficiency Last lab showed normal Vitamin D 32 (03/2018). Currently Vitamin D3 1,000 daily maintenance  Elevated A1c No prior history of PreDM. But has significant fam history of DM. See below. Recent A1c up to 5.6, previous result 5.4. She has reported readings 5.5 to 5.6 at health fair screening. CBGs: not checking Meds: Never on Lifestyle - See below Denies hypoglycemia  HYPERLIPIDEMIA: - Reports concerns with genetic component to elevated cholesterol and concern with family history of cardiovascular disease, see fam history - She has past history of trial on statin failed, she took briefly in age 53s, but could not tolerate. - Last lab shows elevated total cholesterol consistent with previous results, improved HDL up to 49, still persistently elevated LDL 159, previously 130 to 150 - She has never tried Zetia, has considered this in past. Lifestyle: - Diet (eats vegetarian, limits carbs and portion, drinks water mostly, some 2% milk)  - Exercise (weekday M-F treadmill 73min daily)  GERD with history of chronic gastritis Chronic problem, on PPI since 2007. History of EGD per [facility] Dr Allen Norris 12/2015. Previously protonix, then switched to Nexium. - Interval update she was unable to wean or titrate off PPI nexium - Admits some episodic flares, trigger by spicy food. Otherwise can be triggered by benign foods - Continues on Nexium 40mg  daily  Abnormal Uterine Bleeding Followed by Dr Marcelline Mates, she has history of recurrent menstrual bleeding, had work-up performed including labs and [place], pap, now taking Provera 10 days after menstrual cycle, to avoid repeat menstrual bleeding   PMH - Lip Cold Sore, Recurrent - on  Acyclovir PRN, approx x 1 flare yearly - History of IDA anemia - has been resolved. She has had prior normal CBC. Still prefers to take ferrous sulfate regularly, also takes Docusate PRN with it.  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 Feels better now. Denies fever. Active sore throat.  Health Maintenance: - UTD vaccines  Cervical CA Screening: Followed by [facility] Dr Marcelline Mates, recent pap smear 11/10/17, results were negative for malignancy and negative HPV co-testing.  Eye exam / dental completed  Will aim for age 42+ for colonoscopy / cologuard   Depression screen [facility] 2/9 04/20/2018 11/21/2016 03/12/2016  Decreased Interest 0 0 0  Down, Depressed, Hopeless 0 0 0  PHQ - 2 Score 0 0 0    Past Medical History:  Diagnosis Date  . Asthma   . GERD (gastroesophageal reflux disease)   . Vitamin D deficiency    Past Surgical History:  Procedure Laterality Date  . ESOPHAGOGASTRODUODENOSCOPY  2014  . ESOPHAGOGASTRODUODENOSCOPY (EGD) WITH PROPOFOL N/A 01/25/2016   Procedure: ESOPHAGOGASTRODUODENOSCOPY (EGD) WITH PROPOFOL;  Surgeon: Lucilla Lame, MD;  Location: [place];  Service: Endoscopy;  Laterality: N/A;  . TONSILLECTOMY AND ADENOIDECTOMY  1979   Social History   Socioeconomic History  . Marital status: Married    Spouse name: Not on file  . Number of children: 2  . Years of education: 52  . Highest education  level: Not on file  Occupational History  . Occupation: Ship broker: [employer]  . Financial resource strain: Not on file  . Food insecurity:    Worry: Not on file    Inability: Not on file  . Transportation needs:    Medical: Not on file    Non-medical: Not on file  Tobacco Use  . Smoking status: Never Smoker  .  Smokeless tobacco: Never Used  Substance and Sexual Activity  . Alcohol use: No    Alcohol/week: 0.0 oz  . Drug use: No  . Sexual activity: Yes    Partners: Male    Birth control/protection: Condom    Comment: 1 partner   Lifestyle  . Physical activity:    Days per week: Not on file    Minutes per session: Not on file  . Stress: Not on file  Relationships  . Social connections:    Talks on phone: Not on file    Gets together: Not on file    Attends religious service: Not on file    Active member of club or organization: Not on file    Attends meetings of clubs or organizations: Not on file    Relationship status: Not on file  . Intimate partner violence:    Fear of current or ex partner: Not on file    Emotionally abused: Not on file    Physically abused: Not on file    Forced sexual activity: Not on file  Other Topics Concern  . Not on file  Social History Narrative   [name] grew up in [place]. She is married and has 2 children. 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   Family History  Problem Relation Age of Onset  . Hyperlipidemia Mother   . Heart disease Mother   . Hypertension Mother   . Diabetes Mother   . Heart attack Mother   . Heart attack Father   . Hypertension Brother   . Diabetes Brother   . Heart attack Maternal Uncle   . Diabetes Sister   . Hypertension Sister    Current Outpatient Medications on File Prior to Visit  Medication Sig  . albuterol (PROVENTIL HFA;VENTOLIN HFA) 108 (90 Base) MCG/ACT inhaler Inhale 1-2 puffs into the lungs every 6 (six) hours as needed for wheezing or shortness of breath.  . Cholecalciferol (VITAMIN D-3) 1000 units CAPS Take 1 capsule (1,000 Units total) by mouth daily.  [name] Calcium (STOOL SOFTENER PO) Take by mouth as needed.  Marland Kitchen esomeprazole (NEXIUM) 40 MG capsule Take 1 capsule (40 mg total) by mouth daily before breakfast.  . ferrous sulfate 325 (65 FE) MG tablet Take 1 tablet (325 mg  total) by mouth daily with breakfast.  . medroxyPROGESTERone (PROVERA) 10 MG tablet Take 1 tablet (10 mg total) by mouth daily. Take daily on days 14-28 of cycle each month.  . Multiple Vitamin (MULTIVITAMIN) capsule Take 1 capsule by mouth daily.  Marland Kitchen acyclovir (ZOVIRAX) 400 MG tablet Take 1 tablet (400 mg total) by mouth 5 (five) times daily. As needed  . triamcinolone (KENALOG) 0.1 % paste    No current facility-administered medications on file prior to visit.     Review of Systems  Constitutional: Negative for activity change, appetite change, chills, diaphoresis, fatigue and fever.  HENT: Positive for sore throat (improved). Negative for congestion and hearing loss.   Eyes: Negative for visual disturbance.  Respiratory: Negative for apnea, cough, choking, chest tightness, shortness of breath and wheezing.   Cardiovascular: Negative for chest pain, palpitations and leg swelling.  Gastrointestinal: Negative for abdominal pain, anal bleeding, blood in stool, constipation, diarrhea, nausea and vomiting.       GERD controlled on PPI  Endocrine: Negative for cold intolerance and polyuria.  Genitourinary: Positive for menstrual problem (improved on provera). Negative for difficulty urinating, dysuria, frequency and hematuria.  Musculoskeletal: Negative for arthralgias, back pain and neck pain.  Skin: Negative for rash.  Allergic/Immunologic: Negative for environmental allergies.  Neurological: Negative for dizziness, weakness, light-headedness, numbness and headaches.  Hematological: Negative for adenopathy.  Psychiatric/Behavioral: Negative for behavioral problems, dysphoric mood and sleep disturbance. The patient is not nervous/anxious.    Per HPI unless specifically indicated above     Objective:    BP 128/74 (BP Location: Left Arm, Patient Position: Sitting, Cuff Size: Small)   Pulse 75   Temp 98 F (36.7 C) (Oral)   Ht 5' (1.524 m)   Wt 98 lb 6.4 oz (44.6 kg)   LMP 04/20/2018    BMI 19.22 kg/m   Wt Readings from Last 3 Encounters:  04/20/18 98 lb 6.4 oz (44.6 kg)  11/10/17 99 lb 1.6 oz (45 kg)  04/21/17 103 lb 9.6 oz (47 kg)    Physical Exam  Constitutional: She is oriented to person, place, and time. She appears well-developed and well-nourished. No distress.  Well-appearing, comfortable, cooperative  HENT:  Head: Normocephalic and atraumatic.  Mouth/Throat: Oropharynx is clear and moist.  Frontal / maxillary sinuses non-tender. Nares patent without purulence or edema. Bilateral TMs clear without erythema, effusion or bulging. Oropharynx clear without erythema, exudates, edema or asymmetry.  Eyes: Pupils are equal, round, and reactive to light. Conjunctivae and EOM are normal. Right eye exhibits no discharge. Left eye exhibits no discharge.  Neck: Normal range of motion. Neck supple. No thyromegaly present.  No carotid bruits  Cardiovascular: Normal rate, regular rhythm, normal heart sounds and intact distal pulses.  No murmur heard. Pulmonary/Chest: Effort normal and breath sounds normal. No respiratory distress. She has no wheezes. She has no rales.  Abdominal: Soft. Bowel sounds are normal. She exhibits no distension and no mass. There is no tenderness.  Musculoskeletal: Normal range of motion. She exhibits no edema or tenderness.  Upper / Lower Extremities: - Normal muscle tone, strength bilateral upper extremities 5/5, lower extremities 5/5  Lymphadenopathy:    She has no cervical adenopathy.  Neurological: She is alert and oriented to person, place, and time.  Distal sensation intact to light touch all extremities  Skin: Skin is warm and dry. No rash noted. She is not diaphoretic. No erythema.  Psychiatric: She has a normal mood and affect. Her behavior is normal.  Well groomed, good eye contact, normal speech and thoughts  Nursing note and vitals reviewed.  Results for orders placed or performed in visit on 04/14/18  VITAMIN D 25 Hydroxy (Vit-D  Deficiency, Fractures)  Result Value Ref Range   Vit D, 25-Hydroxy 32 30 - 100 ng/mL  Lipid panel  Result Value Ref Range   Cholesterol 234 (H) <200 mg/dL   HDL 49 (L) >50 mg/dL   Triglycerides 131 <150 mg/dL   LDL Cholesterol (Calc) 159 (H) mg/dL (calc)   Total CHOL/HDL Ratio 4.8 <5.0 (calc)   Non-HDL Cholesterol (Calc) 185 (H) <130 mg/dL (calc)  COMPLETE METABOLIC PANEL WITH GFR  Result Value Ref Range   Glucose, Bld 83 65 - 99  mg/dL   BUN 11 7 - 25 mg/dL   Creat 0.69 0.50 - 1.10 mg/dL   GFR, Est Non African American 106 > OR = 60 mL/min/1.42m2   GFR, Est African American 123 > OR = 60 mL/min/1.39m2   BUN/Creatinine Ratio NOT APPLICABLE 6 - 22 (calc)   Sodium 137 135 - 146 mmol/L   Potassium 4.1 3.5 - 5.3 mmol/L   Chloride 102 98 - 110 mmol/L   CO2 26 20 - 32 mmol/L   Calcium 9.6 8.6 - 10.2 mg/dL   Total Protein 6.9 6.1 - 8.1 g/dL   Albumin 4.4 3.6 - 5.1 g/dL   Globulin 2.5 1.9 - 3.7 g/dL (calc)   AG Ratio 1.8 1.0 - 2.5 (calc)   Total Bilirubin 0.5 0.2 - 1.2 mg/dL   Alkaline phosphatase (APISO) 62 33 - 115 U/L   AST 14 10 - 30 U/L   ALT 14 6 - 29 U/L  CBC with Differential/Platelet  Result Value Ref Range   WBC 8.0 3.8 - 10.8 Thousand/uL   RBC 4.45 3.80 - 5.10 Million/uL   Hemoglobin 13.3 11.7 - 15.5 g/dL   HCT 39.4 35.0 - 45.0 %   MCV 88.5 80.0 - 100.0 fL   MCH 29.9 27.0 - 33.0 pg   MCHC 33.8 32.0 - 36.0 g/dL   RDW 13.7 11.0 - 15.0 %   Platelets 287 140 - 400 Thousand/uL   MPV 10.5 7.5 - 12.5 fL   Neutro Abs 5,640 1,500 - 7,800 cells/uL   Lymphs Abs 1,472 850 - 3,900 cells/uL   WBC mixed population 520 200 - 950 cells/uL   Eosinophils Absolute 328 15 - 500 cells/uL   Basophils Absolute 40 0 - 200 cells/uL   Neutrophils Relative % 70.5 %   Total Lymphocyte 18.4 %   Monocytes Relative 6.5 %   Eosinophils Relative 4.1 %   Basophils Relative 0.5 %  Hemoglobin A1c  Result Value Ref Range   Hgb A1c MFr Bld 5.6 <5.7 % of total Hgb   Mean Plasma Glucose 114 (calc)     eAG (mmol/L) 6.3 (calc)      Assessment & Plan:   Problem List Items Addressed This Visit    Abnormal uterine bleeding (AUB)    Followed by [facility] GYN Controlled on Provera after menstrual cycles      Allergic rhinitis due to pollen    Stable without flare May use Flonase PRN      Relevant Medications   fluticasone (FLONASE) 50 MCG/ACT nasal spray   Elevated hemoglobin A1c    Persistent elevated A1c 5.5 now up to 5.6 Concern fam history of diabetes  Plan:  1. Not on any therapy currently  2. Encourage improved lifestyle - low carb, low sugar diet, reduce portion size, continue improving regular exercise 3. Follow-up q 6 months for PreDM A1c      GERD (gastroesophageal reflux disease)    Stable chronic GERD with history of chronic gastritis on prior EGD 12/2015 Controlled on PPI, failed taper off Continue generic Nexium 40mg  daily Diet improvements as advised, avoid trigger foods      History of anemia    Controlled / resolved normocytic anemia secondary to iron deficiency, likely nutritional and secondary with chronic gastritis Continues on Ferrous sulfate Follow-up yearly CBC      Mild intermittent asthma    Stable without exacerbation Not on maintenance Trigger allergies/URI Has PRN Albuterol if need      Mixed hyperlipidemia  Mild uncontrolled cholesterol, improved lifestyle Suspected fam history of hyperlipidemia Last lipid panel 03/2018 Calculated ASCVD 10 yr risk score 0.9% Failed Crestor myalgias (even 5mg  intermittent dosing)  Plan: 1. Discussion on ASCVD risk reduction given her family history but currently does not meet criteria for re-try statin.  - Future consider Zetia or other statin trial 2. Hold ASA - not indicated, and history of gastritis 3. Encourage improved lifestyle - low carb/cholesterol, continue improving regular exercise 4. Follow-up 1 year lipids      Screening for breast cancer    Previously negative mammo No fam  history Defer for a year, may resume next year Until age 52 then q 1-2 yr      Vitamin D deficiency    Stable, controlled now on daily supplement Continue vit d3 1000 daily Monitor yearly VIt D for now       Other Visit Diagnoses    Annual physical exam    -  Primary      Updated Health Maintenance information Reviewed recent lab results with patient Encouraged improvement to lifestyle with diet and exercise   Meds ordered this encounter  Medications  . fluticasone (FLONASE) 50 MCG/ACT nasal spray    Sig: Place 2 sprays into both nostrils daily.    Dispense:  16 g    Refill:  11    Follow up plan: Return in about 6 months (around 10/21/2018) for PreDM A1c.  [name], [facility] 04/20/2018, 11:07 PM

## 2018-04-20 NOTE — Assessment & Plan Note (Addendum)
Mild uncontrolled cholesterol, improved lifestyle Suspected fam history of hyperlipidemia Last lipid panel 03/2018 Calculated ASCVD 10 yr risk score 0.9% Failed Crestor myalgias (even 5mg  intermittent dosing)  Plan: 1. Discussion on ASCVD risk reduction given her family history but currently does not meet criteria for re-try statin.  - Future consider Zetia or other statin trial 2. Hold ASA - not indicated, and history of gastritis 3. Encourage improved lifestyle - low carb/cholesterol, continue improving regular exercise 4. Follow-up 1 year lipids

## 2018-04-20 NOTE — Assessment & Plan Note (Signed)
Stable, controlled now on daily supplement Continue vit d3 1000 daily Monitor yearly VIt D for now

## 2018-04-20 NOTE — Assessment & Plan Note (Addendum)
Controlled / resolved normocytic anemia secondary to iron deficiency, likely nutritional and secondary with chronic gastritis Continues on Ferrous sulfate Follow-up yearly CBC

## 2018-04-20 NOTE — Assessment & Plan Note (Signed)
Followed by [facility] GYN Controlled on Provera after menstrual cycles

## 2018-04-20 NOTE — Assessment & Plan Note (Signed)
Stable without flare May use Flonase PRN

## 2018-04-20 NOTE — Assessment & Plan Note (Signed)
Previously negative mammo No fam history Defer for a year, may resume next year Until age 47 then q 1-2 yr

## 2018-04-20 NOTE — Assessment & Plan Note (Signed)
Stable chronic GERD with history of chronic gastritis on prior EGD 12/2015 Controlled on PPI, failed taper off Continue generic Nexium 40mg  daily Diet improvements as advised, avoid trigger foods

## 2018-04-20 NOTE — Patient Instructions (Addendum)
Thank you for coming to the office today.  Keep up the good work overall!  A1c 5.6, slightly higher from previous reading 5.4 - we can recheck in 6 months for fingerstick A1c.  Otherwise discussed cholesterol - controlled with lifestyle, except still slightly elevated LDL - do not need cholesterol medicine at this time.  Next Pap Smear at GYN 3-5 years from February 2019  Next Mammogram can be next year at [facility]  Please schedule a Follow-up Appointment to: Return in about 6 months (around 10/21/2018) for PreDM A1c.  If you have any other questions or concerns, please feel free to call the office or send a message through [place]. You may also schedule an earlier appointment if necessary.  Additionally, you may be receiving a survey about your experience at our office within a few days to 1 week by e-mail or mail. We value your feedback.  [name], DO [place]

## 2018-04-20 NOTE — Assessment & Plan Note (Signed)
Stable without exacerbation Not on maintenance Trigger allergies/URI Has PRN Albuterol if need

## 2018-04-21 ENCOUNTER — Other Ambulatory Visit: Payer: Self-pay | Admitting: Family Medicine

## 2018-04-21 DIAGNOSIS — R05 Cough: Secondary | ICD-10-CM

## 2018-04-21 DIAGNOSIS — R059 Cough, unspecified: Secondary | ICD-10-CM

## 2018-04-21 MED ORDER — BENZONATATE 100 MG PO CAPS: 100.0000 mg | ORAL_CAPSULE | Freq: Three times a day (TID) | ORAL | 0 refills | Status: DC | PRN

## 2018-08-09 LAB — CBC AND DIFFERENTIAL
HCT: 42 (ref 36–46)
Hemoglobin: 13.9 (ref 12.0–16.0)
Neutrophils Absolute: 4
Platelets: 278 (ref 150–399)
WBC: 6.9

## 2018-08-09 LAB — HEPATIC FUNCTION PANEL
ALT: 17 (ref 7–35)
AST: 16 (ref 13–35)
Alkaline Phosphatase: 73 (ref 25–125)
Bilirubin, Total: 0.4

## 2018-08-09 LAB — LIPID PANEL
CHOLESTEROL: 239 — AB (ref 0–200)
HDL: 54 (ref 35–70)
LDL Cholesterol: 147
TRIGLYCERIDES: 192 — AB (ref 40–160)

## 2018-08-09 LAB — BASIC METABOLIC PANEL
BUN: 8 (ref 4–21)
Creatinine: 0.7 (ref 0.5–1.1)
GLUCOSE: 84
POTASSIUM: 4.7 (ref 3.4–5.3)
Sodium: 139 (ref 137–147)

## 2018-08-09 LAB — TSH: TSH: 3.05 (ref 0.41–5.90)

## 2018-08-09 LAB — HEMOGLOBIN A1C: HEMOGLOBIN A1C: 5.5 (ref 4.0–6.0)

## 2018-08-09 LAB — VITAMIN B12: VITAMIN B 12: 996

## 2018-08-11 ENCOUNTER — Encounter: Payer: Self-pay | Admitting: Family Medicine

## 2018-08-23 DIAGNOSIS — S46811D Strain of other muscles, fascia and tendons at shoulder and upper arm level, right arm, subsequent encounter: Secondary | ICD-10-CM

## 2018-08-23 MED ORDER — CYCLOBENZAPRINE HCL 10 MG PO TABS: 5.0000 mg | ORAL_TABLET | Freq: Every evening | ORAL | 2 refills | Status: DC | PRN

## 2018-10-26 ENCOUNTER — Other Ambulatory Visit: Payer: Self-pay | Admitting: Family Medicine

## 2018-10-26 DIAGNOSIS — K219 Gastro-esophageal reflux disease without esophagitis: Secondary | ICD-10-CM

## 2018-10-26 MED ORDER — ESOMEPRAZOLE MAGNESIUM 40 MG PO CPDR: 40.0000 mg | DELAYED_RELEASE_CAPSULE | Freq: Every day | ORAL | 3 refills | Status: DC

## 2018-10-29 ENCOUNTER — Encounter: Payer: Self-pay | Admitting: Family Medicine

## 2018-10-29 ENCOUNTER — Other Ambulatory Visit: Payer: Self-pay

## 2018-10-29 ENCOUNTER — Ambulatory Visit (INDEPENDENT_AMBULATORY_CARE_PROVIDER_SITE_OTHER): Payer: No Typology Code available for payment source | Admitting: Family Medicine

## 2018-10-29 VITALS — BP 126/74 | HR 84 | Ht 60.0 in | Wt 98.6 lb

## 2018-10-29 DIAGNOSIS — R7309 Other abnormal glucose: Secondary | ICD-10-CM

## 2018-10-29 DIAGNOSIS — G8929 Other chronic pain: Secondary | ICD-10-CM

## 2018-10-29 DIAGNOSIS — M25511 Pain in right shoulder: Secondary | ICD-10-CM

## 2018-10-29 LAB — POCT GLYCOSYLATED HEMOGLOBIN (HGB A1C): Hemoglobin A1C: 5.3 % (ref 4.0–5.6)

## 2018-10-29 NOTE — Progress Notes (Signed)
Subjective:    Patient ID: REIANNA BATDORF, female    DOB: [birth year], 48 y.o.   MRN: [id number]  Meredith Carpenter is a 48 y.o. female presenting on 10/29/2018 for Blood Sugar Problem   HPI   Elevated A1c No prior history of PreDM. But has significant fam history of DM Improved A1c on recent trend. See result below. She is doing well overall with diet, not always adhering to limited fried food but still plant based diet. CBGs:not checking Meds:Never on Denies hypoglycemia, polyuria, numbness tingling  Right Shoulder Pain Reports chronic issue R shoulder, seen in 2019 for same problem, thought trapezius muscle strain, localized area of tenderness. No injury. Trial on Baclofen in past without effective results. Flexeril PRN is helpful but cause side effect or drowsy. - tried topical but caused irritation, not back to PT did not have time to continue this   Health Maintenance: UTD Flu vaccine  Depression screen [facility] 2/9 04/20/2018 11/21/2016 03/12/2016  Decreased Interest 0 0 0  Down, Depressed, Hopeless 0 0 0  PHQ - 2 Score 0 0 0    Social History   Tobacco Use  . Smoking status: Never Smoker  . Smokeless tobacco: Never Used  Substance Use Topics  . Alcohol use: No    Alcohol/week: 0.0 standard drinks  . Drug use: No    Review of Systems Per HPI unless specifically indicated above     Objective:    BP 126/74 (BP Location: Right Arm, Patient Position: Sitting, Cuff Size: Small)   Pulse 84   Ht 5' (1.524 m)   Wt 98 lb 9.6 oz (44.7 kg)   LMP 10/25/2018   BMI 19.26 kg/m   Wt Readings from Last 3 Encounters:  10/29/18 98 lb 9.6 oz (44.7 kg)  04/20/18 98 lb 6.4 oz (44.6 kg)  11/10/17 99 lb 1.6 oz (45 kg)    Physical Exam Vitals signs and nursing note reviewed.  Constitutional:      General: She is not in acute distress.    Appearance: She is well-developed. She is not diaphoretic.     Comments: Well-appearing, comfortable, cooperative  HENT:     Head:  Normocephalic and atraumatic.  Eyes:     General:        Right eye: No discharge.        Left eye: No discharge.     Conjunctiva/sclera: Conjunctivae normal.  Cardiovascular:     Rate and Rhythm: Normal rate.  Pulmonary:     Effort: Pulmonary effort is normal.  Musculoskeletal:     Comments: Right Shoulder Good range of motion. Non tender anteriorly but localized tender scapular region posteriorly. Full flexion extension. Sensation intact. Grip intact strength.  Skin:    General: Skin is warm and dry.     Findings: No erythema or rash.  Neurological:     Mental Status: She is alert and oriented to person, place, and time.  Psychiatric:        Behavior: Behavior normal.     Comments: Well groomed, good eye contact, normal speech and thoughts       Results for orders placed or performed in visit on 10/29/18  POCT glycosylated hemoglobin (Hb A1C)  Result Value Ref Range   Hemoglobin A1C 5.3 4.0 - 5.6 %   Recent Labs    04/14/18 0754 08/09/18 10/29/18 1156  HGBA1C 5.6 5.5 5.3       Assessment & Plan:   Problem List Items Addressed This Visit  Elevated hemoglobin A1c - Primary    Improved now A1c 5.3 Resolved Has history of family w/ DM  Plan Not on therapy, remain off Improving lifestyle diet exercise Follow-up q 6 month Annual w/ labs      Relevant Orders   POCT glycosylated hemoglobin (Hb A1C) (Completed)    Other Visit Diagnoses    Chronic right shoulder pain        Seems to still be localized MSK, trigger point vs chronic muscle tension spasm of trapezius and scapular muscle   Continue Flexeril PRN May take Tylenol / NSAID - future may trial Diclofenac topical PRN Try heating pad as needed May return to PT when ready Follow-up PRN   No orders of the defined types were placed in this encounter.   Follow up plan: Return in about 6 months (around 04/29/2019) for Annual Physical.  [name], DO [facility] 10/29/2018, 11:43 AM

## 2018-10-29 NOTE — Assessment & Plan Note (Signed)
Improved now A1c 5.3 Resolved Has history of family w/ DM  Plan Not on therapy, remain off Improving lifestyle diet exercise Follow-up q 6 month Annual w/ labs

## 2018-10-29 NOTE — Patient Instructions (Addendum)
Thank you for coming to the office today.  Recent Labs    04/14/18 0754 08/09/18 10/29/18 1156  HGBA1C 5.6 5.5 5.3    Future can try topical Diclofenac for shoulder. Feels like muscle spasm still.  If time in future - maybe return to PT  Recommend to start taking Tylenol Extra Strength 500mg  tabs - take 1 to 2 tabs per dose (max 1000mg ) every 6-8 hours for pain (take regularly, don't skip a dose for next 7 days), max 24 hour daily dose is 6 tablets or 3000mg . In the future you can repeat the same everyday Tylenol course for 1-2 weeks at a time.   --------------------------------  DUE for FASTING BLOOD WORK (no food or drink after midnight before the lab appointment, only water or coffee without cream/sugar on the morning of)  SCHEDULE "Lab Only" visit in the morning at the clinic for lab draw in 6 months  - Make sure Lab Only appointment is at about 1 week before your next appointment, so that results will be available  For Lab Results, once available within 2-3 days of blood draw, you can can log in to MyChart online to view your results and a brief explanation. Also, we can discuss results at next follow-up visit.   Please schedule a Follow-up Appointment to: Return in about 6 months (around 04/29/2019) for Annual Physical.  If you have any other questions or concerns, please feel free to call the office or send a message through [place]. You may also schedule an earlier appointment if necessary.  Additionally, you may be receiving a survey about your experience at our office within a few days to 1 week by e-mail or mail. We value your feedback.  [name], DO [facility]

## 2018-11-02 ENCOUNTER — Telehealth: Payer: Self-pay | Admitting: Family Medicine

## 2018-11-02 ENCOUNTER — Other Ambulatory Visit: Payer: Self-pay | Admitting: Family Medicine

## 2018-11-02 DIAGNOSIS — E782 Mixed hyperlipidemia: Secondary | ICD-10-CM

## 2018-11-02 DIAGNOSIS — J3089 Other allergic rhinitis: Secondary | ICD-10-CM

## 2018-11-02 DIAGNOSIS — Z0000 Encounter for general adult medical examination without abnormal findings: Secondary | ICD-10-CM

## 2018-11-02 DIAGNOSIS — Z862 Personal history of diseases of the blood and blood-forming organs and certain disorders involving the immune mechanism: Secondary | ICD-10-CM

## 2018-11-02 DIAGNOSIS — E559 Vitamin D deficiency, unspecified: Secondary | ICD-10-CM

## 2018-11-02 MED ORDER — MONTELUKAST SODIUM 10 MG PO TABS: 10.0000 mg | ORAL_TABLET | Freq: Every day | ORAL | 0 refills | Status: DC

## 2018-11-02 NOTE — Telephone Encounter (Signed)
Patient reports allergic symptoms of some eye watering and itching and possible hive or urticaria. Uncertain exact allergy. No known long term seasonal or environmental allergies. She has been on zyrtec intermittently only if had symptoms, not taking long term. She tried prednisone recently with good results. Now has recurrence of some allergy symptoms.  I offered her singulair 10mg  nightly to help reduce any allergy symptoms she was having, she can try zyrtec OTC daily + singulair 10mg  nightly for 1-2 weeks for now to see if resolves then may stop and see if recurrence. Advised that likely this combination is better at preventing allergies rather than curing an allergy. If not improving she may seek allergy test or other treatment if needed  Meredith Carpenter, [facility] 11/02/2018, 5:03 PM

## 2018-11-05 ENCOUNTER — Ambulatory Visit: Payer: Self-pay | Admitting: Family Medicine

## 2018-11-21 ENCOUNTER — Telehealth: Payer: No Typology Code available for payment source | Admitting: Family

## 2018-11-21 DIAGNOSIS — J111 Influenza due to unidentified influenza virus with other respiratory manifestations: Secondary | ICD-10-CM

## 2018-11-21 MED ORDER — OSELTAMIVIR PHOSPHATE 75 MG PO CAPS: 75.0000 mg | ORAL_CAPSULE | Freq: Two times a day (BID) | ORAL | 0 refills | Status: DC

## 2018-11-21 NOTE — Progress Notes (Signed)
Greater than 5 minutes, yet less than 10 minutes of time have been spent researching, coordinating, and implementing care for this patient today.  Thank you for the details you included in the comment boxes. Those details are very helpful in determining the best course of treatment for you and help [place] to provide the best care.  E visit for Flu like symptoms   We are sorry that you are not feeling well.  Here is how we plan to help! Based on what you have shared with me it looks like you may have a respiratory virus that may be influenza.  Influenza or "the flu" is   an infection caused by a respiratory virus. The flu virus is highly contagious and persons who did not receive their yearly flu vaccination may "catch" the flu from close contact.  We have anti-viral medications to treat the viruses that cause this infection. They are not a "cure" and only shorten the course of the infection. These prescriptions are most effective when they are given within the first 2 days of "flu" symptoms. Antiviral medication are indicated if you have a high risk of complications from the flu. You should  also consider an antiviral medication if you are in close contact with someone who is at risk. These medications can help patients avoid complications from the flu  but have side effects that you should know. Possible side effects from Tamiflu or oseltamivir include nausea, vomiting, diarrhea, dizziness, headaches, eye redness, sleep problems or other respiratory symptoms. You should not take Tamiflu if you have an allergy to oseltamivir or any to the ingredients in Tamiflu.  Based upon your symptoms and potential risk factors I have prescribed Oseltamivir (Tamiflu).  It has been sent to your designated pharmacy.  You will take one 75 mg capsule orally twice a day for the next 5 days.  ANYONE WHO HAS FLU SYMPTOMS SHOULD: . Stay home. The flu is highly contagious and going out or to work exposes others! . Be sure to  drink plenty of fluids. Water is fine as well as fruit juices, sodas and electrolyte beverages. You may want to stay away from caffeine or alcohol. If you are nauseated, try taking small sips of liquids. How do you know if you are getting enough fluid? Your urine should be a pale yellow or almost colorless. . Get rest. . Taking a steamy shower or using a humidifier may help nasal congestion and ease sore throat pain. Using a saline nasal spray works much the same way. . Cough drops, hard candies and sore throat lozenges may ease your cough. . Line up a caregiver. Have someone check on you regularly.   GET HELP RIGHT AWAY IF: . You cannot keep down liquids or your medications. . You become short of breath . Your fell like you are going to pass out or loose consciousness. . Your symptoms persist after you have completed your treatment plan MAKE SURE YOU   Understand these instructions.  Will watch your condition.  Will get help right away if you are not doing well or get worse.  Your e-visit answers were reviewed by a board certified advanced clinical practitioner to complete your personal care plan.  Depending on the condition, your plan could have included both over the counter or prescription medications.  If there is a problem please reply  once you have received a response from your provider.  Your safety is important to [place].  If you have drug allergies check  your prescription carefully.    You can use MyChart to ask questions about today's visit, request a non-urgent call back, or ask for a work or school excuse for 24 hours related to this e-Visit. If it has been greater than 24 hours you will need to follow up with your provider, or enter a new e-Visit to address those concerns.  You will get an e-mail in the next two days asking about your experience.  I hope that your e-visit has been valuable and will speed your recovery. Thank you for using e-visits.

## 2018-11-25 ENCOUNTER — Telehealth: Payer: Self-pay | Admitting: Family Medicine

## 2018-11-25 DIAGNOSIS — J209 Acute bronchitis, unspecified: Secondary | ICD-10-CM

## 2018-11-25 DIAGNOSIS — J4520 Mild intermittent asthma, uncomplicated: Secondary | ICD-10-CM

## 2018-11-25 MED ORDER — AZITHROMYCIN 250 MG PO TABS: ORAL_TABLET | ORAL | 0 refills | Status: DC

## 2018-11-25 MED ORDER — VENTOLIN HFA 108 (90 BASE) MCG/ACT IN AERS: 2.0000 | INHALATION_SPRAY | Freq: Four times a day (QID) | RESPIRATORY_TRACT | 0 refills | Status: DC | PRN

## 2018-11-25 MED ORDER — PSEUDOEPH-BROMPHEN-DM 30-2-10 MG/5ML PO SYRP: 5.0000 mL | ORAL_SOLUTION | Freq: Four times a day (QID) | ORAL | 0 refills | Status: DC | PRN

## 2018-11-25 NOTE — Telephone Encounter (Signed)
Patient recently treated on E visit for influenza with tamiflu, she has 1 more day left, nwo worsening symptoms with productive cough darker sputum, and some coughing worse at night.  Unable to schedule apt today due to no same day appointment slots open. She has some left over cough syrup but it made her dizzy.  Will recommend treatment at this time for bronchitis for azithromycin zpak, and non opioid cough syrup bromfed, and refill her albuterol inhaler PRN.  [name], [facility] 11/25/2018, 8:34 AM

## 2018-12-30 ENCOUNTER — Telehealth: Payer: Self-pay | Admitting: Obstetrics and Gynecology

## 2018-12-30 NOTE — Telephone Encounter (Signed)
error 

## 2019-01-03 ENCOUNTER — Other Ambulatory Visit: Payer: Self-pay | Admitting: Obstetrics and Gynecology

## 2019-01-03 NOTE — Telephone Encounter (Signed)
We need to see if the patient still needs this prescription?  If she does, we can refill it for 3 months, and try to get her in for an appointment/annual exam during the next 2-3 months.

## 2019-01-28 DIAGNOSIS — L508 Other urticaria: Secondary | ICD-10-CM

## 2019-01-28 MED ORDER — TRIAMCINOLONE ACETONIDE 0.1 % EX CREA: 1.0000 "application " | TOPICAL_CREAM | Freq: Two times a day (BID) | CUTANEOUS | 2 refills | Status: DC | PRN

## 2019-01-28 NOTE — Addendum Note (Signed)
Addended by: Olin Hauser on: 01/28/2019 04:48 PM   Modules accepted: Orders

## 2019-03-16 ENCOUNTER — Other Ambulatory Visit: Payer: Self-pay | Admitting: Family Medicine

## 2019-03-16 DIAGNOSIS — J3089 Other allergic rhinitis: Secondary | ICD-10-CM

## 2019-03-16 MED ORDER — MONTELUKAST SODIUM 10 MG PO TABS: 10.0000 mg | ORAL_TABLET | Freq: Every day | ORAL | 1 refills | Status: DC

## 2019-03-31 ENCOUNTER — Encounter: Payer: Self-pay | Admitting: Obstetrics and Gynecology

## 2019-04-05 ENCOUNTER — Telehealth: Payer: Self-pay | Admitting: Certified Nurse Midwife

## 2019-04-05 NOTE — Telephone Encounter (Signed)
Pt called to get coding for AE to give to her insurance. Thank you.

## 2019-04-18 ENCOUNTER — Other Ambulatory Visit: Payer: Self-pay | Admitting: Family Medicine

## 2019-04-18 DIAGNOSIS — Z0000 Encounter for general adult medical examination without abnormal findings: Secondary | ICD-10-CM

## 2019-04-20 ENCOUNTER — Other Ambulatory Visit: Payer: No Typology Code available for payment source

## 2019-04-20 DIAGNOSIS — Z862 Personal history of diseases of the blood and blood-forming organs and certain disorders involving the immune mechanism: Secondary | ICD-10-CM

## 2019-04-20 DIAGNOSIS — E559 Vitamin D deficiency, unspecified: Secondary | ICD-10-CM

## 2019-04-20 DIAGNOSIS — Z0000 Encounter for general adult medical examination without abnormal findings: Secondary | ICD-10-CM

## 2019-04-20 DIAGNOSIS — E782 Mixed hyperlipidemia: Secondary | ICD-10-CM

## 2019-04-21 ENCOUNTER — Other Ambulatory Visit: Payer: Self-pay | Admitting: Family Medicine

## 2019-04-21 DIAGNOSIS — Z0000 Encounter for general adult medical examination without abnormal findings: Secondary | ICD-10-CM

## 2019-04-22 ENCOUNTER — Other Ambulatory Visit: Payer: Self-pay | Admitting: Family Medicine

## 2019-04-22 DIAGNOSIS — Z0000 Encounter for general adult medical examination without abnormal findings: Secondary | ICD-10-CM

## 2019-04-22 LAB — LIPID PANEL
Cholesterol: 269 mg/dL — ABNORMAL HIGH (ref <200)
HDL: 58 mg/dL (ref >=50)
LDL Cholesterol (Calc): 175 mg/dL (calc) — ABNORMAL HIGH
Non-HDL Cholesterol (Calc): 211 mg/dL (calc) — ABNORMAL HIGH (ref <130)
Total CHOL/HDL Ratio: 4.6 (calc) (ref <5.0)
Triglycerides: 202 mg/dL — ABNORMAL HIGH (ref <150)

## 2019-04-22 LAB — HEMOGLOBIN A1C
Hgb A1c MFr Bld: 5.3 % of total Hgb (ref <5.7)
Mean Plasma Glucose: 105 (calc)
eAG (mmol/L): 5.8 (calc)

## 2019-04-22 LAB — COMPLETE METABOLIC PANEL WITH GFR
AG Ratio: 1.7 (calc) (ref 1.0–2.5)
ALT: 19 U/L (ref 6–29)
AST: 18 U/L (ref 10–35)
Albumin: 4.7 g/dL (ref 3.6–5.1)
Alkaline phosphatase (APISO): 62 U/L (ref 31–125)
BUN: 11 mg/dL (ref 7–25)
CO2: 25 mmol/L (ref 20–32)
Calcium: 9.9 mg/dL (ref 8.6–10.2)
Chloride: 99 mmol/L (ref 98–110)
Creat: 0.83 mg/dL (ref 0.50–1.10)
GFR, Est African American: 99 mL/min/{1.73_m2} (ref >=60)
GFR, Est Non African American: 85 mL/min/{1.73_m2} (ref >=60)
Globulin: 2.7 g/dL (calc) (ref 1.9–3.7)
Glucose, Bld: 32 mg/dL — CL (ref 65–99)
Potassium: 4.4 mmol/L (ref 3.5–5.3)
Sodium: 138 mmol/L (ref 135–146)
Total Bilirubin: 0.5 mg/dL (ref 0.2–1.2)
Total Protein: 7.4 g/dL (ref 6.1–8.1)

## 2019-04-22 LAB — TIQ-MISC

## 2019-04-22 LAB — CBC WITH DIFFERENTIAL/PLATELET
Absolute Monocytes: 429 cells/uL (ref 200–950)
Basophils Absolute: 37 cells/uL (ref 0–200)
Basophils Relative: 0.5 %
Eosinophils Absolute: 192 cells/uL (ref 15–500)
Eosinophils Relative: 2.6 %
HCT: 43.2 % (ref 35.0–45.0)
Hemoglobin: 14.2 g/dL (ref 11.7–15.5)
Lymphs Abs: 1658 cells/uL (ref 850–3900)
MCH: 30.9 pg (ref 27.0–33.0)
MCHC: 32.9 g/dL (ref 32.0–36.0)
MCV: 94.1 fL (ref 80.0–100.0)
MPV: 10.7 fL (ref 7.5–12.5)
Monocytes Relative: 5.8 %
Neutro Abs: 5084 cells/uL (ref 1500–7800)
Neutrophils Relative %: 68.7 %
Platelets: 292 10*3/uL (ref 140–400)
RBC: 4.59 10*6/uL (ref 3.80–5.10)
RDW: 13.8 % (ref 11.0–15.0)
Total Lymphocyte: 22.4 %
WBC: 7.4 10*3/uL (ref 3.8–10.8)

## 2019-04-22 LAB — QUANTIFERON-TB GOLD PLUS

## 2019-04-24 LAB — QUANTIFERON-TB GOLD PLUS
Mitogen-NIL: >10 IU/mL
NIL: 0.03 IU/mL
QuantiFERON-TB Gold Plus: NEGATIVE
TB1-NIL: 0.09 IU/mL
TB2-NIL: 0.11 IU/mL

## 2019-04-27 ENCOUNTER — Other Ambulatory Visit: Payer: Self-pay

## 2019-04-27 ENCOUNTER — Encounter: Payer: Self-pay | Admitting: Family Medicine

## 2019-04-27 ENCOUNTER — Ambulatory Visit (INDEPENDENT_AMBULATORY_CARE_PROVIDER_SITE_OTHER): Payer: No Typology Code available for payment source | Admitting: Family Medicine

## 2019-04-27 VITALS — BP 116/73 | HR 79 | Temp 97.9°F | Ht 60.0 in | Wt 98.6 lb

## 2019-04-27 DIAGNOSIS — Z0000 Encounter for general adult medical examination without abnormal findings: Secondary | ICD-10-CM

## 2019-04-27 MED ORDER — RED YEAST RICE 600 MG PO CAPS: 600.0000 mg | ORAL_CAPSULE | Freq: Every day | ORAL | Status: DC

## 2019-04-27 NOTE — Patient Instructions (Addendum)
Thank you for coming to the office today.  Recommend Red Yeast Rice supplement, natural. 600mg  capsule once daily with dinner to lower LDL cholesterol  Good news is that HDL cholesterol is improved. But total is still high.  A1c is improved as well.  Recent Labs    08/09/18 10/29/18 1156 04/20/19 0919  HGBA1C 5.5 5.3 5.3    Nexium if trying to reduce - can try OTC 20mg  - and try to phase down on this.  Day 1 - 20mg  one capsule TWICE a day Day 2 - 20mg  one capsulel ONCE a day - Alternate for 1 week - then switch to 20mg  ONCE DAILY for a week - Then alternate 20mg  one pill every OTHER day  Left foot - try warm water epsom salt soaks, and pumice stone or emory board to file down callus, can also use a Corn Pad or Toe Cushion (sticky cushion pad to protect it from rubbing)  DUE for FASTING BLOOD WORK (no food or drink after midnight before the lab appointment, only water or coffee without cream/sugar on the morning of)  SCHEDULE "Lab Only" visit in the morning at the clinic for lab draw in 6 MONTHS   - Make sure Lab Only appointment is at about 1 week before your next appointment, so that results will be available  For Lab Results, once available within 2-3 days of blood draw, you can can log in to MyChart online to view your results and a brief explanation. Also, we can discuss results at next follow-up visit.   Please schedule a Follow-up Appointment to: Return in about 1 year (around 04/26/2020) for Annual Physical.  If you have any other questions or concerns, please feel free to call the office or send a message through [place]. You may also schedule an earlier appointment if necessary.  Additionally, you may be receiving a survey about your experience at our office within a few days to 1 week by e-mail or mail. We value your feedback.  [name], DO [facility]

## 2019-04-27 NOTE — Progress Notes (Signed)
Subjective:    Patient ID: Meredith Carpenter, female    DOB: 1971/03/28, 48 y.o.   MRN: [id number]  Meredith Carpenter is a 48 y.o. female presenting on 04/27/2019 for Annual Exam   HPI   Here for Annual Physical and Lab Review.  WELLNESS LIFESTYLE / History Elevated A1c / Hyperlipidemia Reviewed lab results. Improved A1c to 5.3. Elevated LDL now >170, her HDL is improved and increased as well. No prior history of PreDM. But has significant fam history of DM and HLD Failed Crestor in past.  Lifestyle - Weight - unchanged, stable - Diet - balanced, vegetarian, does eat frequent fried foods, no sugar added, limits sweets, plant based diet, drinks all water - Exercise- treadmill 30 min in morning M-F every day CBGs:not checking but has access to glucometer if need Meds:Never on meds for these Denies hypoglycemia, polyuria, numbness tingling  History of anemia Prior results in 2015 had low ferritin in past. Improved on iron. She continues MVI and iron supplement. She says if stop taking iron can get leg pains at times.  Follow up GERD - Taking Nexium 40mg  daily. Tried to stop this before, but had rebound symptoms next day off med, restarted it. In past tried Dexilant as well with very good results.  History of Allergies / Allergic response urticaria Improved on Singulair and Zyrtec PRN.   Health Maintenance:  Colon CA Screening: No prior colon screening. Defers until age 16+, consider cologuard. Asymptomatic. No fam history.  Followed by GYN for screening cervical cancer, breast cancer screening.  Note TB Screen Quantiferon Gold - NEGATIVE. Resulted. For future academic program.  Depression screen [facility] 2/9 04/27/2019 04/20/2018 11/21/2016  Decreased Interest 0 0 0  Down, Depressed, Hopeless 0 0 0  PHQ - 2 Score 0 0 0    Past Medical History:  Diagnosis Date  . Asthma   . GERD (gastroesophageal reflux disease)   . Vitamin D deficiency    Past Surgical History:  Procedure  Laterality Date  . ESOPHAGOGASTRODUODENOSCOPY  2014  . ESOPHAGOGASTRODUODENOSCOPY (EGD) WITH PROPOFOL N/A 01/25/2016   Procedure: ESOPHAGOGASTRODUODENOSCOPY (EGD) WITH PROPOFOL;  Surgeon: Lucilla Lame, MD;  Location: [place];  Service: Endoscopy;  Laterality: N/A;  . TONSILLECTOMY AND ADENOIDECTOMY  1979   Social History   Socioeconomic History  . Marital status: Married    Spouse name: Not on file  . Number of children: 2  . Years of education: 73  . Highest education level: Not on file  Occupational History  . Occupation: Ship broker: [employer]  . Financial resource strain: Not on file  . Food insecurity    Worry: Not on file    Inability: Not on file  . Transportation needs    Medical: Not on file    Non-medical: Not on file  Tobacco Use  . Smoking status: Never Smoker  . Smokeless tobacco: Never Used  Substance and Sexual Activity  . Alcohol use: No    Alcohol/week: 0.0 standard drinks  . Drug use: No  . Sexual activity: Yes    Partners: Male    Birth control/protection: Condom    Comment: 1 partner   Lifestyle  . Physical activity    Days per week: Not on file    Minutes per session: Not on file  . Stress: Not on file  Relationships  . Social connections    Talks on phone: Not on file  Gets together: Not on file    Attends religious service: Not on file    Active member of club or organization: Not on file    Attends meetings of clubs or organizations: Not on file    Relationship status: Not on file  . Intimate partner violence    Fear of current or ex partner: Not on file    Emotionally abused: Not on file    Physically abused: Not on file    Forced sexual activity: Not on file  Other Topics Concern  . Not on file  Social History Narrative   [name] grew up in [place]. She is married and has 2 children. 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   Family History   Problem Relation Age of Onset  . Hyperlipidemia Mother   . Heart disease Mother   . Hypertension Mother   . Diabetes Mother   . Heart attack Mother   . Heart attack Father   . Hypertension Brother   . Diabetes Brother   . Heart attack Maternal Uncle   . Diabetes Sister   . Hypertension Sister   . Colon cancer Neg Hx   . Breast cancer Neg Hx    Current Outpatient Medications on File Prior to Visit  Medication Sig  . acyclovir (ZOVIRAX) 400 MG tablet Take 1 tablet (400 mg total) by mouth 5 (five) times daily. As needed  . Cholecalciferol (VITAMIN D-3) 1000 units CAPS Take 1 capsule (1,000 Units total) by mouth daily.  . cyclobenzaprine (FLEXERIL) 10 MG tablet Take 0.5-1 tablets (5-10 mg total) by mouth at bedtime as needed for muscle spasms.  [name] Calcium (STOOL SOFTENER PO) Take by mouth as needed.  Marland Kitchen esomeprazole (NEXIUM) 40 MG capsule Take 1 capsule (40 mg total) by mouth daily before breakfast.  . ferrous sulfate 325 (65 FE) MG tablet Take 1 tablet (325 mg total) by mouth daily with breakfast.  . fluticasone (FLONASE) 50 MCG/ACT nasal spray Place 2 sprays into both nostrils daily.  . medroxyPROGESTERone (PROVERA) 10 MG tablet TAKE 1 TABLET BY MOUTH DAILY. TAKE DAILY ON DAYS 14-28 OF CYCLE EACH MONTH.  . montelukast (SINGULAIR) 10 MG tablet Take 1 tablet (10 mg total) by mouth at bedtime. (Patient taking differently: Take 10 mg by mouth as needed. )  . Multiple Vitamin (MULTIVITAMIN) capsule Take 1 capsule by mouth daily.  Marland Kitchen triamcinolone cream (KENALOG) 0.1 % Apply 1 application topically 2 (two) times daily as needed. Up to 2 weeks at a time. Use on neck and body, avoid use on face  . VENTOLIN HFA 108 (90 Base) MCG/ACT inhaler Inhale 2 puffs into the lungs every 6 (six) hours as needed for wheezing or shortness of breath.   No current facility-administered medications on file prior to visit.     Review of Systems  Constitutional: Negative for activity change, appetite  change, chills, diaphoresis, fatigue and fever.  HENT: Negative for congestion and hearing loss.   Eyes: Negative for visual disturbance.  Respiratory: Negative for apnea, cough, chest tightness, shortness of breath and wheezing.   Cardiovascular: Negative for chest pain, palpitations and leg swelling.  Gastrointestinal: Negative for abdominal pain, anal bleeding, blood in stool, constipation, diarrhea, nausea and vomiting.  Endocrine: Negative for cold intolerance.  Genitourinary: Negative for difficulty urinating, dysuria, frequency and hematuria.  Musculoskeletal: Negative for arthralgias, back pain and neck pain.  Skin: Negative for rash.  Allergic/Immunologic: Negative for environmental allergies.  Neurological: Negative for  dizziness, weakness, light-headedness, numbness and headaches.  Hematological: Negative for adenopathy.  Psychiatric/Behavioral: Negative for behavioral problems, dysphoric mood and sleep disturbance. The patient is not nervous/anxious.    Per HPI unless specifically indicated above      Objective:    BP 116/73 (BP Location: Right Arm, Patient Position: Sitting, Cuff Size: Small)   Pulse 79   Temp 97.9 F (36.6 C) (Oral)   Ht 5' (1.524 m)   Wt 98 lb 9.6 oz (44.7 kg)   BMI 19.26 kg/m   Wt Readings from Last 3 Encounters:  04/27/19 98 lb 9.6 oz (44.7 kg)  10/29/18 98 lb 9.6 oz (44.7 kg)  04/20/18 98 lb 6.4 oz (44.6 kg)    Physical Exam Vitals signs and nursing note reviewed.  Constitutional:      General: She is not in acute distress.    Appearance: She is well-developed. She is not diaphoretic.     Comments: Well-appearing, comfortable, cooperative  HENT:     Head: Normocephalic and atraumatic.  Eyes:     General:        Right eye: No discharge.        Left eye: No discharge.     Conjunctiva/sclera: Conjunctivae normal.     Pupils: Pupils are equal, round, and reactive to light.  Neck:     Musculoskeletal: Normal range of motion and neck  supple.     Thyroid: No thyromegaly.  Cardiovascular:     Rate and Rhythm: Normal rate and regular rhythm.     Heart sounds: Normal heart sounds. No murmur.  Pulmonary:     Effort: Pulmonary effort is normal. No respiratory distress.     Breath sounds: Normal breath sounds. No wheezing or rales.  Abdominal:     General: Bowel sounds are normal. There is no distension.     Palpations: Abdomen is soft. There is no mass.     Tenderness: There is no abdominal tenderness.  Musculoskeletal: Normal range of motion.        General: No tenderness.     Comments: Upper / Lower Extremities: - Normal muscle tone, strength bilateral upper extremities 5/5, lower extremities 5/5  Lymphadenopathy:     Cervical: No cervical adenopathy.  Skin:    General: Skin is warm and dry.     Findings: No erythema or rash.  Neurological:     Mental Status: She is alert and oriented to person, place, and time.     Comments: Distal sensation intact to light touch all extremities  Psychiatric:        Behavior: Behavior normal.     Comments: Well groomed, good eye contact, normal speech and thoughts    Results for orders placed or performed in visit on 04/20/19  [facility]  Result Value Ref Range   QUESTION/PROBLEM:     QUESTION: Verify Collection Date and or Time.   QuantiFERON-TB Gold Plus  Result Value Ref Range   QuantiFERON-TB Gold Plus CANCELED   Lipid panel  Result Value Ref Range   Cholesterol 269 (H) <200 mg/dL   HDL 58 > OR = 50 mg/dL   Triglycerides 202 (H) <150 mg/dL   LDL Cholesterol (Calc) 175 (H) mg/dL (calc)   Total CHOL/HDL Ratio 4.6 <5.0 (calc)   Non-HDL Cholesterol (Calc) 211 (H) <130 mg/dL (calc)  COMPLETE METABOLIC PANEL WITH GFR  Result Value Ref Range   Glucose, Bld 32 (LL) 65 - 99 mg/dL   BUN 11 7 - 25 mg/dL   Creat  0.83 0.50 - 1.10 mg/dL   GFR, Est Non African American 85 > OR = 60 mL/min/1.46m2   GFR, Est African American 99 > OR = 60 mL/min/1.76m2   BUN/Creatinine Ratio NOT  APPLICABLE 6 - 22 (calc)   Sodium 138 135 - 146 mmol/L   Potassium 4.4 3.5 - 5.3 mmol/L   Chloride 99 98 - 110 mmol/L   CO2 25 20 - 32 mmol/L   Calcium 9.9 8.6 - 10.2 mg/dL   Total Protein 7.4 6.1 - 8.1 g/dL   Albumin 4.7 3.6 - 5.1 g/dL   Globulin 2.7 1.9 - 3.7 g/dL (calc)   AG Ratio 1.7 1.0 - 2.5 (calc)   Total Bilirubin 0.5 0.2 - 1.2 mg/dL   Alkaline phosphatase (APISO) 62 31 - 125 U/L   AST 18 10 - 35 U/L   ALT 19 6 - 29 U/L  CBC with Differential/Platelet  Result Value Ref Range   WBC 7.4 3.8 - 10.8 Thousand/uL   RBC 4.59 3.80 - 5.10 Million/uL   Hemoglobin 14.2 11.7 - 15.5 g/dL   HCT 43.2 35.0 - 45.0 %   MCV 94.1 80.0 - 100.0 fL   MCH 30.9 27.0 - 33.0 pg   MCHC 32.9 32.0 - 36.0 g/dL   RDW 13.8 11.0 - 15.0 %   Platelets 292 140 - 400 Thousand/uL   MPV 10.7 7.5 - 12.5 fL   Neutro Abs 5,084 1,500 - 7,800 cells/uL   Lymphs Abs 1,658 850 - 3,900 cells/uL   Absolute Monocytes 429 200 - 950 cells/uL   Eosinophils Absolute 192 15 - 500 cells/uL   Basophils Absolute 37 0 - 200 cells/uL   Neutrophils Relative % 68.7 %   Total Lymphocyte 22.4 %   Monocytes Relative 5.8 %   Eosinophils Relative 2.6 %   Basophils Relative 0.5 %  Hemoglobin A1c  Result Value Ref Range   Hgb A1c MFr Bld 5.3 <5.7 % of total Hgb   Mean Plasma Glucose 105 (calc)   eAG (mmol/L) 5.8 (calc)  QuantiFERON-TB Gold Plus  Result Value Ref Range   QuantiFERON-TB Gold Plus NEGATIVE NEGATIVE   NIL 0.03 IU/mL   Mitogen-NIL >10.00 IU/mL   TB1-NIL 0.09 IU/mL   TB2-NIL 0.11 IU/mL      Assessment & Plan:   Problem List Items Addressed This Visit    None    Visit Diagnoses    Annual physical exam    -  Primary      Updated Health Maintenance information - UTD currently, defer colon CA screening by age 18 - F/u with GYN for routine pap, and future breast CA screening Reviewed recent lab results with patient - Try to taper down on Nexium from 40 down to 20mg  BID alternating dosing per AVS Encouraged  improvement to lifestyle with diet and exercise - Defer statin therapy at this time, goal LDL < 130. If higher than >200 in future reconsider statin - With hyperlipidemia, LDL elevated. Advised to trial OTC natural supplement Red Yeast Rice 600mg  daily with meal in evening, can increase dose in future to 600 BID if need. She cannot take fish oil due to dietary restriction. - A1c well controlled on lifestyle, re-check yearly - Maintain healthy weight  Meds ordered this encounter  Medications  . Red Yeast Rice 600 MG CAPS    Sig: Take 1 capsule (600 mg total) by mouth daily with supper.    Follow up plan: Return in about 1 year (around 04/26/2020) for Annual  Physical.  May return in 6 months for fasting lab only for Lipid panel  [name], [facility] 04/27/2019, 11:16 AM

## 2019-04-29 ENCOUNTER — Other Ambulatory Visit: Payer: Self-pay

## 2019-04-29 ENCOUNTER — Ambulatory Visit (INDEPENDENT_AMBULATORY_CARE_PROVIDER_SITE_OTHER): Payer: No Typology Code available for payment source | Admitting: Certified Nurse Midwife

## 2019-04-29 ENCOUNTER — Encounter: Payer: Self-pay | Admitting: Certified Nurse Midwife

## 2019-04-29 VITALS — BP 122/81 | HR 80 | Ht 60.0 in | Wt 101.0 lb

## 2019-04-29 DIAGNOSIS — Z01419 Encounter for gynecological examination (general) (routine) without abnormal findings: Secondary | ICD-10-CM | POA: Diagnosis not present

## 2019-04-29 DIAGNOSIS — Z1239 Encounter for other screening for malignant neoplasm of breast: Secondary | ICD-10-CM

## 2019-04-29 NOTE — Progress Notes (Signed)
Patient here for annual exam. No complaints.

## 2019-04-29 NOTE — Progress Notes (Signed)
GYNECOLOGY ANNUAL PREVENTATIVE CARE ENCOUNTER NOTE  History:     Meredith Carpenter is a 48 y.o. [id number] female here for a routine annual gynecologic exam.  Current complaints: None.   Denies abnormal vaginal bleeding, discharge, pelvic pain, problems with intercourse or other gynecologic concerns.    Gynecologic History Patient's last menstrual period was 04/01/2019 (exact date). Contraception: none and Provera to regulate periods  Last Pap: 11/10/17 Results were: normal with negative HPV Last mammogram: 09/11/16. Results were: normal  Obstetric History OB History  Gravida Para Term Preterm AB Living  2 2 2     2   SAB TAB Ectopic Multiple Live Births          2    # Outcome Date GA Lbr Len/2nd Weight Sex Delivery Anes PTL Lv  2 Term 05/01/06   5 lb 7 oz (2.466 kg) F Vag-Spont EPI N LIV  1 Term 02/03/97   6 lb 13 oz (3.09 kg) M Vag-Spont EPI  LIV    Past Medical History:  Diagnosis Date  . Asthma   . GERD (gastroesophageal reflux disease)   . Vitamin D deficiency     Past Surgical History:  Procedure Laterality Date  . ESOPHAGOGASTRODUODENOSCOPY  2014  . ESOPHAGOGASTRODUODENOSCOPY (EGD) WITH PROPOFOL N/A 01/25/2016   Procedure: ESOPHAGOGASTRODUODENOSCOPY (EGD) WITH PROPOFOL;  Surgeon: Lucilla Lame, MD;  Location: [place];  Service: Endoscopy;  Laterality: N/A;  . TONSILLECTOMY AND ADENOIDECTOMY  1979    Current Outpatient Medications on File Prior to Visit  Medication Sig Dispense Refill  . acyclovir (ZOVIRAX) 400 MG tablet Take 1 tablet (400 mg total) by mouth 5 (five) times daily. As needed 35 tablet 0  . Cholecalciferol (VITAMIN D-3) 1000 units CAPS Take 1 capsule (1,000 Units total) by mouth daily. 60 capsule   . cyclobenzaprine (FLEXERIL) 10 MG tablet Take 0.5-1 tablets (5-10 mg total) by mouth at bedtime as needed for muscle spasms. 30 tablet 2  . Docusate Calcium (STOOL SOFTENER PO) Take by mouth as needed.    Marland Kitchen esomeprazole (NEXIUM) 40 MG capsule Take 1  capsule (40 mg total) by mouth daily before breakfast. 90 capsule 3  . ferrous sulfate 325 (65 FE) MG tablet Take 1 tablet (325 mg total) by mouth daily with breakfast. 60 tablet 5  . fluticasone (FLONASE) 50 MCG/ACT nasal spray Place 2 sprays into both nostrils daily. 16 g 11  . medroxyPROGESTERone (PROVERA) 10 MG tablet TAKE 1 TABLET BY MOUTH DAILY. TAKE DAILY ON DAYS 14-28 OF CYCLE EACH MONTH. 28 tablet 3  . montelukast (SINGULAIR) 10 MG tablet Take 1 tablet (10 mg total) by mouth at bedtime. (Patient taking differently: Take 10 mg by mouth as needed. ) 90 tablet 1  . Multiple Vitamin (MULTIVITAMIN) capsule Take 1 capsule by mouth daily.    . Red Yeast Rice 600 MG CAPS Take 1 capsule (600 mg total) by mouth daily with supper.    . triamcinolone cream (KENALOG) 0.1 % Apply 1 application topically 2 (two) times daily as needed. Up to 2 weeks at a time. Use on neck and body, avoid use on face 30 g 2  . VENTOLIN HFA 108 (90 Base) MCG/ACT inhaler Inhale 2 puffs into the lungs every 6 (six) hours as needed for wheezing or shortness of breath. 1 Inhaler 0   No current facility-administered medications on file prior to visit.     No Known Allergies  Social History:  reports that she has never smoked. She has never  used smokeless tobacco. She reports that she does not drink alcohol or use drugs.  Exercises 5 days a week ( treadmill) x 30 min. No smoking, drinking or drugs.   Family History  Problem Relation Age of Onset  . Hyperlipidemia Mother   . Heart disease Mother   . Hypertension Mother   . Diabetes Mother   . Heart attack Mother   . Heart attack Father   . Hypertension Brother   . Diabetes Brother   . Heart attack Maternal Uncle   . Diabetes Sister   . Hypertension Sister   . Colon cancer Neg Hx   . Breast cancer Neg Hx     The following portions of the patient's history were reviewed and updated as appropriate: allergies, current medications, past family history, past medical  history, past social history, past surgical history and problem list.  Review of Systems Pertinent items noted in HPI and remainder of comprehensive ROS otherwise negative.  Physical Exam:  BP 122/81   Pulse 80   Ht 5' (1.524 m)   Wt 101 lb (45.8 kg)   LMP 04/01/2019 (Exact Date)   BMI 19.73 kg/m  CONSTITUTIONAL: Well-developed, well-nourished female in no acute distress.  HENT:  Normocephalic, atraumatic, External right and left ear normal. Oropharynx is clear and moist EYES: Conjunctivae and EOM are normal. Pupils are equal, round, and reactive to light. No scleral icterus.  NECK: Normal range of motion, supple, no masses.  Normal thyroid.  SKIN: Skin is warm and dry. No rash noted. Not diaphoretic. No erythema. No pallor. MUSCULOSKELETAL: Normal range of motion. No tenderness.  No cyanosis, clubbing, or edema.  2+ distal pulses. NEUROLOGIC: Alert and oriented to person, place, and time. Normal reflexes, muscle tone coordination. No cranial nerve deficit noted. PSYCHIATRIC: Normal mood and affect. Normal behavior. Normal judgment and thought content. CARDIOVASCULAR: Normal heart rate noted, regular rhythm RESPIRATORY: Clear to auscultation bilaterally. Effort and breath sounds normal, no problems with respiration noted. BREASTS: Symmetric in size. No masses, skin changes, nipple drainage, or lymphadenopathy. ABDOMEN: Soft, normal bowel sounds, no distention noted.  No tenderness, rebound or guarding.  PELVIC: Normal appearing external genitalia; normal appearing vaginal mucosa and cervix.  No abnormal discharge noted.  Pap smear not indicated.  Normal uterine size, no other palpable masses, no uterine or adnexal tenderness.   Assessment and Plan:   Annual Well Women GYN exam  Pap smear not indicated Mammogram ordered Labs: TSH done 2019, lipid panel  & Hem A1c done 2020. No labs indicated  Routine preventative health maintenance measures emphasized. Please refer to After Visit  Summary for other counseling recommendations.      Philip Aspen, CNM

## 2019-04-29 NOTE — Patient Instructions (Signed)

## 2019-07-04 ENCOUNTER — Other Ambulatory Visit: Payer: Self-pay | Admitting: Family Medicine

## 2019-07-04 DIAGNOSIS — K219 Gastro-esophageal reflux disease without esophagitis: Secondary | ICD-10-CM

## 2019-07-04 MED ORDER — ESOMEPRAZOLE MAGNESIUM 20 MG PO CPDR: 20.0000 mg | DELAYED_RELEASE_CAPSULE | Freq: Two times a day (BID) | ORAL | 1 refills | Status: DC

## 2019-07-12 ENCOUNTER — Telehealth: Payer: Self-pay

## 2019-07-12 NOTE — Telephone Encounter (Signed)
Chart open to start the prior auth. Process on Nexium. Marland Kitchen

## 2019-09-29 ENCOUNTER — Other Ambulatory Visit: Payer: Self-pay | Admitting: Family Medicine

## 2019-09-29 DIAGNOSIS — J209 Acute bronchitis, unspecified: Secondary | ICD-10-CM

## 2019-09-29 DIAGNOSIS — J4520 Mild intermittent asthma, uncomplicated: Secondary | ICD-10-CM

## 2019-10-04 ENCOUNTER — Telehealth: Payer: Self-pay

## 2019-10-04 DIAGNOSIS — J4520 Mild intermittent asthma, uncomplicated: Secondary | ICD-10-CM

## 2019-10-04 DIAGNOSIS — J209 Acute bronchitis, unspecified: Secondary | ICD-10-CM

## 2019-10-04 MED ORDER — ALBUTEROL SULFATE HFA 108 (90 BASE) MCG/ACT IN AERS: 2.0000 | INHALATION_SPRAY | Freq: Four times a day (QID) | RESPIRATORY_TRACT | 2 refills | Status: DC | PRN

## 2019-10-04 NOTE — Telephone Encounter (Signed)
Changed to Albuterol HFA  [name], DO [facility] 10/04/2019, 4:21 PM

## 2020-01-18 ENCOUNTER — Other Ambulatory Visit: Payer: Self-pay | Admitting: Family Medicine

## 2020-01-18 DIAGNOSIS — S46811D Strain of other muscles, fascia and tendons at shoulder and upper arm level, right arm, subsequent encounter: Secondary | ICD-10-CM

## 2020-01-18 MED ORDER — CYCLOBENZAPRINE HCL 10 MG PO TABS: 5.0000 mg | ORAL_TABLET | Freq: Every day | ORAL | 2 refills | Status: DC | PRN

## 2020-02-28 ENCOUNTER — Ambulatory Visit (INDEPENDENT_AMBULATORY_CARE_PROVIDER_SITE_OTHER): Payer: No Typology Code available for payment source | Admitting: Family Medicine

## 2020-02-28 ENCOUNTER — Other Ambulatory Visit: Payer: Self-pay

## 2020-02-28 ENCOUNTER — Encounter: Payer: Self-pay | Admitting: Family Medicine

## 2020-02-28 VITALS — BP 130/75 | HR 77 | Temp 98.0°F | Ht 60.0 in | Wt 101.0 lb

## 2020-02-28 DIAGNOSIS — M545 Low back pain, unspecified: Secondary | ICD-10-CM

## 2020-02-28 MED ORDER — PREDNISONE 10 MG PO TABS: ORAL_TABLET | ORAL | 0 refills | Status: DC

## 2020-02-28 NOTE — Patient Instructions (Addendum)
Thank you for coming to the office today.  1. For your Back Pain - I think that this is due to Muscle Spasms or strain.   2. Anti inflammatory Prednisone taper  - Start Prednisone taper (steroid anti-inflammatory) for nerve irritation with pain in legs. Each pill is 10mg . Take 6 pills (60mg  daily) for 1 day at same time with breakfast, then each day reduce dose by 1 pill, so 5 pills, then 4, then 3, then 2 then 1 (last 6 days). Do not take any Ibuprofen or Aleve while taking the Prednisone.   3. INCREASE Flexeril to 10mg  one whole tablet NIGHTLY and half tablet during day if need max is every 8 hours  - if need new rx let me know  4. May use Tylenol Extra Str 500mg  tabs - may take 1-2 tablets every 6 hours as needed  5. Recommend to start using heating pad on your lower back 1-2x daily for few weeks   Please schedule a Follow-up Appointment to: Return in about 4 weeks (around 03/27/2020), or if symptoms worsen or fail to improve, for back pain.  If you have any other questions or concerns, please feel free to call the office or send a message through [place]. You may also schedule an earlier appointment if necessary.  Additionally, you may be receiving a survey about your experience at our office within a few days to 1 week by e-mail or mail. We value your feedback.  [name], DO [facility], [facility]             Low Back Pain Exercises  See other page with pictures of each exercise.  Start with 1 or 2 of these exercises that you are most comfortable with. Do not do any exercises that cause you significant worsening pain. Some of these may cause some "stretching soreness" but it should go away after you stop the exercise, and get better over time. Gradually increase up to 3-4 exercises as tolerated.  Standing hamstring stretch: Place the heel of your leg on a stool about 15 inches high. Keep your knee straight. Lean forward, bending at the hips until  you feel a mild stretch in the back of your thigh. Make sure you do not roll your shoulders and bend at the waist when doing this or you will stretch your lower back instead. Hold the stretch for 15 to 30 seconds. Repeat 3 times. Repeat the same stretch on your other leg.  Cat and camel: Get down on your hands and knees. Let your stomach sag, allowing your back to curve downward. Hold this position for 5 seconds. Then arch your back and hold for 5 seconds. Do 3 sets of 10.  Quadriped Arm/Leg Raises: Get down on your hands and knees. Tighten your abdominal muscles to stiffen your spine. While keeping your abdominals tight, raise one arm and the opposite leg away from you. Hold this position for 5 seconds. Lower your arm and leg slowly and alternate sides. Do this 10 times on each side.  Pelvic tilt: Lie on your back with your knees bent and your feet flat on the floor. Tighten your abdominal muscles and push your lower back into the floor. Hold this position for 5 seconds, then relax. Do 3 sets of 10.  Partial curl: Lie on your back with your knees bent and your feet flat on the floor. Tighten your stomach muscles and flatten your back against the floor. Tuck your chin to your chest.  With your hands stretched out in front of you, curl your upper body forward until your shoulders clear the floor. Hold this position for 3 seconds. Don't hold your breath. It helps to breathe out as you lift your shoulders up. Relax. Repeat 10 times. Build to 3 sets of 10. To challenge yourself, clasp your hands behind your head and keep your elbows out to the side.  Lower trunk rotation: Lie on your back with your knees bent and your feet flat on the floor. Tighten your abdominal muscles and push your lower back into the floor. Keeping your shoulders down flat, gently rotate your legs to one side, then the other as far as you can. Repeat 10 to 20 times.  Single knee to chest stretch: Lie on your back with your legs straight  out in front of you. Bring one knee up to your chest and grasp the back of your thigh. Pull your knee toward your chest, stretching your buttock muscle. Hold this position for 15 to 30 seconds and return to the starting position. Repeat 3 times on each side.  Double knee to chest: Lie on your back with your knees bent and your feet flat on the floor. Tighten your abdominal muscles and push your lower back into the floor. Pull both knees up to your chest. Hold for 5 seconds and repeat 10 to 20 times.

## 2020-02-28 NOTE — Progress Notes (Signed)
Subjective:    Patient ID: Meredith Carpenter, female    DOB: October 08, 1970, 49 y.o.   MRN: [id number]  Meredith Carpenter is a 49 y.o. female presenting on 02/28/2020 for Back Pain (constant sharp lower back pain that radiates down the Right glute , mostly on the Right side. Changing position makes the pain worse x 4 days. Pt state she doesn't remember doing anything to injury her back.  )  Patient presents for a same day appointment.  HPI   LOW BACK PAIN, Acute, R sided Standing or resting, will have some stiffness and soreness, if changing positions or wrong movement will have sharper pain radiates into gluteal muscle - This last week she had increased activity with groceries and cooking, but did not have any injury. Next day she had increasing soreness. Has some spasm episodes. Next 1-2 days she had pain more suddenly. If prolong sitting without movement she has no pain but then worse if stand up. - Difficulty sleeping at night due to pain, change in position would cause pain - She tried a Flexeril 5mg  without relief, then yesterday her sister in law offered her a Tizanidine 4mg  and tried half a tablet with temporary relief for 2 hours then pain returned within 2 hours. - Tried muscle rub Bengay and ice / heat without significant relief - Admits difficulty sleeping due to pain - Denies any fevers/chills, numbness, tingling, weakness, loss of control bladder/bowel incontinence or retention, unintentional wt loss, night sweats    Depression screen [facility] 2/9 04/27/2019 04/20/2018 11/21/2016  Decreased Interest 0 0 0  Down, Depressed, Hopeless 0 0 0  PHQ - 2 Score 0 0 0    Social History   Tobacco Use  . Smoking status: Never Smoker  . Smokeless tobacco: Never Used  Substance Use Topics  . Alcohol use: No    Alcohol/week: 0.0 standard drinks  . Drug use: No    Review of Systems Per HPI unless specifically indicated above     Objective:    BP 130/75 (BP Location: Left Arm, Patient  Position: Sitting, Cuff Size: Small)   Pulse 77   Temp 98 F (36.7 C) (Temporal)   Ht 5' (1.524 m)   Wt 101 lb (45.8 kg)   BMI 19.73 kg/m   Wt Readings from Last 3 Encounters:  02/28/20 101 lb (45.8 kg)  04/29/19 101 lb (45.8 kg)  04/27/19 98 lb 9.6 oz (44.7 kg)    Physical Exam Vitals and nursing note reviewed.  Constitutional:      General: She is not in acute distress.    Appearance: She is well-developed. She is not diaphoretic.     Comments: Well-appearing, comfortable, cooperative  HENT:     Head: Normocephalic and atraumatic.  Eyes:     General:        Right eye: No discharge.        Left eye: No discharge.     Conjunctiva/sclera: Conjunctivae normal.  Cardiovascular:     Rate and Rhythm: Normal rate.  Pulmonary:     Effort: Pulmonary effort is normal.  Musculoskeletal:     Comments: Low Back Inspection: Normal appearance, thin body habitus, no spinal deformity, symmetrical. Palpation: No tenderness over spinous processes. R>L lumbar paraspinal and gluteal muscles with very mild hypertonicity spasm but non tender today on exam ROM: Full active ROM forward flex / back extension, rotation L/R without discomfort - Hip flexion is intact standing without radicular symptoms Special Testing: Standing SLR negative  for radicular pain bilaterally  Strength: Bilateral hip flex/ext 5/5, knee flex/ext 5/5, ankle dorsiflex/plantarflex 5/5 Neurovascular: intact distal sensation to light touch   Skin:    General: Skin is warm and dry.     Findings: No erythema or rash.  Neurological:     Mental Status: She is alert and oriented to person, place, and time.  Psychiatric:        Behavior: Behavior normal.     Comments: Well groomed, good eye contact, normal speech and thoughts    Results for orders placed or performed in visit on 04/20/19  [facility]  Result Value Ref Range   QUESTION/PROBLEM:     QUESTION: Verify Collection Date and or Time.   QuantiFERON-TB Gold Plus    Result Value Ref Range   QuantiFERON-TB Gold Plus CANCELED   Lipid panel  Result Value Ref Range   Cholesterol 269 (H) <200 mg/dL   HDL 58 > OR = 50 mg/dL   Triglycerides 202 (H) <150 mg/dL   LDL Cholesterol (Calc) 175 (H) mg/dL (calc)   Total CHOL/HDL Ratio 4.6 <5.0 (calc)   Non-HDL Cholesterol (Calc) 211 (H) <130 mg/dL (calc)  COMPLETE METABOLIC PANEL WITH GFR  Result Value Ref Range   Glucose, Bld 32 (LL) 65 - 99 mg/dL   BUN 11 7 - 25 mg/dL   Creat 0.83 0.50 - 1.10 mg/dL   GFR, Est Non African American 85 > OR = 60 mL/min/1.81m2   GFR, Est African American 99 > OR = 60 mL/min/1.3m2   BUN/Creatinine Ratio NOT APPLICABLE 6 - 22 (calc)   Sodium 138 135 - 146 mmol/L   Potassium 4.4 3.5 - 5.3 mmol/L   Chloride 99 98 - 110 mmol/L   CO2 25 20 - 32 mmol/L   Calcium 9.9 8.6 - 10.2 mg/dL   Total Protein 7.4 6.1 - 8.1 g/dL   Albumin 4.7 3.6 - 5.1 g/dL   Globulin 2.7 1.9 - 3.7 g/dL (calc)   AG Ratio 1.7 1.0 - 2.5 (calc)   Total Bilirubin 0.5 0.2 - 1.2 mg/dL   Alkaline phosphatase (APISO) 62 31 - 125 U/L   AST 18 10 - 35 U/L   ALT 19 6 - 29 U/L  CBC with Differential/Platelet  Result Value Ref Range   WBC 7.4 3.8 - 10.8 Thousand/uL   RBC 4.59 3.80 - 5.10 Million/uL   Hemoglobin 14.2 11.7 - 15.5 g/dL   HCT 43.2 35.0 - 45.0 %   MCV 94.1 80.0 - 100.0 fL   MCH 30.9 27.0 - 33.0 pg   MCHC 32.9 32.0 - 36.0 g/dL   RDW 13.8 11.0 - 15.0 %   Platelets 292 140 - 400 Thousand/uL   MPV 10.7 7.5 - 12.5 fL   Neutro Abs 5,084 1,500 - 7,800 cells/uL   Lymphs Abs 1,658 850 - 3,900 cells/uL   Absolute Monocytes 429 200 - 950 cells/uL   Eosinophils Absolute 192 15 - 500 cells/uL   Basophils Absolute 37 0 - 200 cells/uL   Neutrophils Relative % 68.7 %   Total Lymphocyte 22.4 %   Monocytes Relative 5.8 %   Eosinophils Relative 2.6 %   Basophils Relative 0.5 %  Hemoglobin A1c  Result Value Ref Range   Hgb A1c MFr Bld 5.3 <5.7 % of total Hgb   Mean Plasma Glucose 105 (calc)   eAG (mmol/L)  5.8 (calc)  QuantiFERON-TB Gold Plus  Result Value Ref Range   QuantiFERON-TB Gold Plus NEGATIVE NEGATIVE   NIL 0.03  IU/mL   Mitogen-NIL >10.00 IU/mL   TB1-NIL 0.09 IU/mL   TB2-NIL 0.11 IU/mL      Assessment & Plan:   Problem List Items Addressed This Visit    None    Visit Diagnoses    Acute right-sided low back pain without sciatica    -  Primary   Relevant Medications   predniSONE (DELTASONE) 10 MG tablet      Acute R LBP without associated sciatica. Suspect likely due to muscle spasm/strain, without known injury or trauma. But some recent increased activity. No known OA/DJD or other prior problem, has had muscle spasm in shoulder in past. Most likely muscle spasm with back pain. - No red flag symptoms. Negative SLR for radiculopathy Not responding to conservative therapy  Plan: 1. Start prednisone dose pak taper 6 days 60 to 10mg  daily as advised 2. Increase Flexeril to 10mg  take whole tab in PM and half during day PRN 3. May use Tylenol PRN, continue conservative therapy 4. Keep exercising, use stretching first before and limit incline on treadmill  Work note for 1 day return on Thurs  Follow-up 4-6 weeks if not improved for re-evaluation, consider X-ray imaging, trial of PT, and possibly referral to Orthopedic   Meds ordered this encounter  Medications  . predniSONE (DELTASONE) 10 MG tablet    Sig: Take 6 tabs with breakfast Day 1, 5 tabs Day 2, 4 tabs Day 3, 3 tabs Day 4, 2 tabs Day 5, 1 tab Day 6.    Dispense:  21 tablet    Refill:  0      Follow up plan: Return in about 4 weeks (around 03/27/2020), or if symptoms worsen or fail to improve, for back pain.   [name], [facility] 02/28/2020, 4:38 PM

## 2020-04-05 ENCOUNTER — Other Ambulatory Visit: Payer: Self-pay | Admitting: Family Medicine

## 2020-04-05 DIAGNOSIS — Z0000 Encounter for general adult medical examination without abnormal findings: Secondary | ICD-10-CM

## 2020-04-05 DIAGNOSIS — R7309 Other abnormal glucose: Secondary | ICD-10-CM

## 2020-04-05 DIAGNOSIS — E782 Mixed hyperlipidemia: Secondary | ICD-10-CM

## 2020-04-05 DIAGNOSIS — Z862 Personal history of diseases of the blood and blood-forming organs and certain disorders involving the immune mechanism: Secondary | ICD-10-CM

## 2020-04-18 ENCOUNTER — Other Ambulatory Visit: Payer: Self-pay | Admitting: Family Medicine

## 2020-04-18 DIAGNOSIS — E782 Mixed hyperlipidemia: Secondary | ICD-10-CM

## 2020-04-18 DIAGNOSIS — R7309 Other abnormal glucose: Secondary | ICD-10-CM

## 2020-04-18 DIAGNOSIS — Z862 Personal history of diseases of the blood and blood-forming organs and certain disorders involving the immune mechanism: Secondary | ICD-10-CM

## 2020-04-18 DIAGNOSIS — Z0000 Encounter for general adult medical examination without abnormal findings: Secondary | ICD-10-CM

## 2020-04-20 ENCOUNTER — Other Ambulatory Visit: Payer: Self-pay

## 2020-04-20 ENCOUNTER — Other Ambulatory Visit: Payer: No Typology Code available for payment source

## 2020-04-20 LAB — CBC WITH DIFFERENTIAL/PLATELET
Absolute Monocytes: 395 cells/uL (ref 200–950)
Basophils Absolute: 40 cells/uL (ref 0–200)
Basophils Relative: 0.6 %
Eosinophils Absolute: 295 cells/uL (ref 15–500)
Eosinophils Relative: 4.4 %
HCT: 42.9 % (ref 35.0–45.0)
Hemoglobin: 14 g/dL (ref 11.7–15.5)
Lymphs Abs: 1628 cells/uL (ref 850–3900)
MCH: 30.1 pg (ref 27.0–33.0)
MCHC: 32.6 g/dL (ref 32.0–36.0)
MCV: 92.3 fL (ref 80.0–100.0)
MPV: 10.5 fL (ref 7.5–12.5)
Monocytes Relative: 5.9 %
Neutro Abs: 4342 cells/uL (ref 1500–7800)
Neutrophils Relative %: 64.8 %
Platelets: 292 10*3/uL (ref 140–400)
RBC: 4.65 10*6/uL (ref 3.80–5.10)
RDW: 13.2 % (ref 11.0–15.0)
Total Lymphocyte: 24.3 %
WBC: 6.7 10*3/uL (ref 3.8–10.8)

## 2020-04-20 LAB — COMPLETE METABOLIC PANEL WITH GFR
AG Ratio: 1.9 (calc) (ref 1.0–2.5)
ALT: 18 U/L (ref 6–29)
AST: 16 U/L (ref 10–35)
Albumin: 4.6 g/dL (ref 3.6–5.1)
Alkaline phosphatase (APISO): 72 U/L (ref 31–125)
BUN: 12 mg/dL (ref 7–25)
CO2: 27 mmol/L (ref 20–32)
Calcium: 9.6 mg/dL (ref 8.6–10.2)
Chloride: 103 mmol/L (ref 98–110)
Creat: 0.75 mg/dL (ref 0.50–1.10)
GFR, Est African American: 111 mL/min/{1.73_m2} (ref >=60)
GFR, Est Non African American: 96 mL/min/{1.73_m2} (ref >=60)
Globulin: 2.4 g/dL (calc) (ref 1.9–3.7)
Glucose, Bld: 98 mg/dL (ref 65–99)
Potassium: 4.4 mmol/L (ref 3.5–5.3)
Sodium: 138 mmol/L (ref 135–146)
Total Bilirubin: 0.5 mg/dL (ref 0.2–1.2)
Total Protein: 7 g/dL (ref 6.1–8.1)

## 2020-04-20 LAB — HEMOGLOBIN A1C
Hgb A1c MFr Bld: 5.4 % of total Hgb (ref <5.7)
Mean Plasma Glucose: 108 (calc)
eAG (mmol/L): 6 (calc)

## 2020-04-20 LAB — LIPID PANEL
Cholesterol: 261 mg/dL — ABNORMAL HIGH (ref <200)
HDL: 53 mg/dL (ref >=50)
LDL Cholesterol (Calc): 182 mg/dL (calc) — ABNORMAL HIGH
Non-HDL Cholesterol (Calc): 208 mg/dL (calc) — ABNORMAL HIGH (ref <130)
Total CHOL/HDL Ratio: 4.9 (calc) (ref <5.0)
Triglycerides: 124 mg/dL (ref <150)

## 2020-04-26 ENCOUNTER — Other Ambulatory Visit: Payer: Self-pay | Admitting: Family Medicine

## 2020-04-26 ENCOUNTER — Other Ambulatory Visit: Payer: Self-pay

## 2020-04-26 ENCOUNTER — Encounter: Payer: Self-pay | Admitting: Family Medicine

## 2020-04-26 ENCOUNTER — Ambulatory Visit (INDEPENDENT_AMBULATORY_CARE_PROVIDER_SITE_OTHER): Payer: No Typology Code available for payment source | Admitting: Family Medicine

## 2020-04-26 VITALS — BP 130/74 | HR 79 | Temp 98.2°F | Resp 16 | Ht 60.0 in | Wt 101.4 lb

## 2020-04-26 DIAGNOSIS — E782 Mixed hyperlipidemia: Secondary | ICD-10-CM

## 2020-04-26 DIAGNOSIS — Z0000 Encounter for general adult medical examination without abnormal findings: Secondary | ICD-10-CM

## 2020-04-26 DIAGNOSIS — L508 Other urticaria: Secondary | ICD-10-CM

## 2020-04-26 DIAGNOSIS — E7849 Other hyperlipidemia: Secondary | ICD-10-CM

## 2020-04-26 DIAGNOSIS — K219 Gastro-esophageal reflux disease without esophagitis: Secondary | ICD-10-CM

## 2020-04-26 DIAGNOSIS — R7309 Other abnormal glucose: Secondary | ICD-10-CM

## 2020-04-26 MED ORDER — EZETIMIBE 10 MG PO TABS: 10.0000 mg | ORAL_TABLET | Freq: Every day | ORAL | 2 refills | Status: DC

## 2020-04-26 MED ORDER — TRIAMCINOLONE ACETONIDE 0.1 % EX CREA: 1.0000 "application " | TOPICAL_CREAM | Freq: Two times a day (BID) | CUTANEOUS | 2 refills | Status: DC | PRN

## 2020-04-26 NOTE — Progress Notes (Signed)
Subjective:    Patient ID: Meredith Carpenter, female    DOB: 05-Jul-1971, 49 y.o.   MRN: [id number]  Meredith Carpenter is a 49 y.o. female presenting on 04/26/2020 for Annual Exam   HPI   Here for Annual Physical and Lab Review.  WELLNESS LIFESTYLE / History Elevated A1c / Familial Hyperlipidemia. Reviewed lab results. Last A1c 5.4, similar to prior 5.3 to 5.4 range. Elevated LDL now 182. Otherwise triglycerides have resolved now normal range. Normal HDL No prior history of PreDM. But has significant fam history of DM and HLD Failed Crestor (rosuvastatin), Atorvastatin, Pravastatin. Recently failed red yeast rice trial due to myalgias Fam history of early CAD MI age 36-55  - Weight - unchanged, stable - Diet - balanced, vegetarian, does eat frequent fried foods, no sugar added, limits sweets, plant based diet, drinks all water - Exercise- treadmill 30 min (non incline) in morning M-F every day, admits some muscle cramps CBGs:not checking but has access to glucometer if need Meds:Never on meds for these Denies hypoglycemia, polyuria, numbness tingling  History of anemia Prior results in 2015 had low ferritin in past. Improved on iron. She continues MVI and iron supplement. She says if stop taking iron can get leg pains at times.  Follow up GERD - Taking Nexium 20mg  BID.  History of Allergies / Allergic response urticaria Improved on Singulair and Zyrtec PRN.   Health Maintenance:  Colon CA Screening: No prior colon screening. Defers until age 78+, consider cologuard. Asymptomatic. No fam history.  Followed by GYN for screening cervical cancer, breast cancer screening.    Depression screen [facility] 2/9 04/27/2019 04/20/2018 11/21/2016  Decreased Interest 0 0 0  Down, Depressed, Hopeless 0 0 0  PHQ - 2 Score 0 0 0    Past Medical History:  Diagnosis Date  . Asthma   . GERD (gastroesophageal reflux disease)   . Vitamin D deficiency    Past Surgical History:   Procedure Laterality Date  . ESOPHAGOGASTRODUODENOSCOPY  2014  . ESOPHAGOGASTRODUODENOSCOPY (EGD) WITH PROPOFOL N/A 01/25/2016   Procedure: ESOPHAGOGASTRODUODENOSCOPY (EGD) WITH PROPOFOL;  Surgeon: Lucilla Lame, MD;  Location: [place];  Service: Endoscopy;  Laterality: N/A;  . TONSILLECTOMY AND ADENOIDECTOMY  1979   Social History   Socioeconomic History  . Marital status: Married    Spouse name: Not on file  . Number of children: 2  . Years of education: 67  . Highest education level: Not on file  Occupational History  . Occupation: Ship broker: [employer]  Tobacco Use  . Smoking status: Never Smoker  . Smokeless tobacco: Never Used  Vaping Use  . Vaping Use: Never used  Substance and Sexual Activity  . Alcohol use: No    Alcohol/week: 0.0 standard drinks  . Drug use: No  . Sexual activity: Yes    Partners: Male    Birth control/protection: Condom    Comment: 1 partner   Other Topics Concern  . Not on file  Social History Narrative   Meredith Carpenter grew up in [place]. She is married and has 2 children. 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   Social Determinants of Health   Financial Resource Strain:   . Difficulty of Paying Living Expenses:   Food Insecurity:   . Worried About Charity fundraiser in the Last Year:   . Arboriculturist in the Last Year:   Transportation Needs:   .  Lack of Transportation (Medical):   Marland Kitchen Lack of Transportation (Non-Medical):   Physical Activity:   . Days of Exercise per Week:   . Minutes of Exercise per Session:   Stress:   . Feeling of Stress :   Social Connections:   . Frequency of Communication with Friends and Family:   . Frequency of Social Gatherings with Friends and Family:   . Attends Religious Services:   . Active Member of Clubs or Organizations:   . Attends Archivist Meetings:   Marland Kitchen Marital Status:   Intimate Partner Violence:   . Fear of  Current or Ex-Partner:   . Emotionally Abused:   Marland Kitchen Physically Abused:   . Sexually Abused:    Family History  Problem Relation Age of Onset  . Hyperlipidemia Mother   . Heart disease Mother   . Hypertension Mother   . Diabetes Mother   . Heart attack Mother 8  . Heart attack Father 43  . Hypertension Brother   . Diabetes Brother   . Heart disease Brother 56  . Heart attack Maternal Uncle   . Diabetes Sister   . Hypertension Sister   . Heart disease Sister 57  . Colon cancer Neg Hx   . Breast cancer Neg Hx    Current Outpatient Medications on File Prior to Visit  Medication Sig  . acyclovir (ZOVIRAX) 400 MG tablet Take 1 tablet (400 mg total) by mouth 5 (five) times daily. As needed  . albuterol (VENTOLIN HFA) 108 (90 Base) MCG/ACT inhaler Inhale 2 puffs into the lungs every 6 (six) hours as needed for wheezing or shortness of breath (cough).  . Cholecalciferol (VITAMIN D-3) 1000 units CAPS Take 1 capsule (1,000 Units total) by mouth daily.  . cyclobenzaprine (FLEXERIL) 10 MG tablet Take 0.5-1 tablets (5-10 mg total) by mouth daily as needed for muscle spasms.  Meredith Carpenter Calcium (STOOL SOFTENER PO) Take by mouth as needed.  . ferrous sulfate 325 (65 FE) MG tablet Take 1 tablet (325 mg total) by mouth daily with breakfast.  . fluticasone (FLONASE) 50 MCG/ACT nasal spray Place 2 sprays into both nostrils daily.  . medroxyPROGESTERone (PROVERA) 10 MG tablet TAKE 1 TABLET BY MOUTH DAILY. TAKE DAILY ON DAYS 14-28 OF CYCLE EACH MONTH.  . montelukast (SINGULAIR) 10 MG tablet Take 1 tablet (10 mg total) by mouth at bedtime. (Patient taking differently: Take 10 mg by mouth as needed. )  . Multiple Vitamin (MULTIVITAMIN) capsule Take 1 capsule by mouth daily.   No current facility-administered medications on file prior to visit.    Review of Systems  Constitutional: Negative for activity change, appetite change, chills, diaphoresis, fatigue and fever.  HENT: Negative for congestion  and hearing loss.   Eyes: Negative for visual disturbance.  Respiratory: Negative for apnea, cough, chest tightness, shortness of breath and wheezing.   Cardiovascular: Negative for chest pain, palpitations and leg swelling.  Gastrointestinal: Negative for abdominal pain, anal bleeding, blood in stool, constipation, diarrhea, nausea and vomiting.  Endocrine: Negative for cold intolerance.  Genitourinary: Negative for difficulty urinating, dysuria, frequency and hematuria.  Musculoskeletal: Negative for arthralgias, back pain and neck pain.  Skin: Negative for rash.  Allergic/Immunologic: Negative for environmental allergies.  Neurological: Negative for dizziness, weakness, light-headedness, numbness and headaches.  Hematological: Negative for adenopathy.  Psychiatric/Behavioral: Negative for behavioral problems, dysphoric mood and sleep disturbance. The patient is not nervous/anxious.    Per HPI unless specifically indicated above      Objective:  BP (!) 130/74 (BP Location: Left Arm, Cuff Size: Small)   Pulse 79   Temp 98.2 F (36.8 C) (Oral)   Resp 16   Ht 5' (1.524 m)   Wt 101 lb 6.4 oz (46 kg)   BMI 19.80 kg/m   Wt Readings from Last 3 Encounters:  04/26/20 101 lb 6.4 oz (46 kg)  02/28/20 101 lb (45.8 kg)  04/29/19 101 lb (45.8 kg)    Physical Exam Vitals and nursing note reviewed.  Constitutional:      General: She is not in acute distress.    Appearance: She is well-developed. She is not diaphoretic.     Comments: Well-appearing, comfortable, cooperative  HENT:     Head: Normocephalic and atraumatic.  Eyes:     General:        Right eye: No discharge.        Left eye: No discharge.     Conjunctiva/sclera: Conjunctivae normal.     Pupils: Pupils are equal, round, and reactive to light.  Neck:     Thyroid: No thyromegaly.     Vascular: No carotid bruit.  Cardiovascular:     Rate and Rhythm: Normal rate and regular rhythm.     Heart sounds: Normal heart  sounds. No murmur heard.   Pulmonary:     Effort: Pulmonary effort is normal. No respiratory distress.     Breath sounds: Normal breath sounds. No wheezing or rales.  Abdominal:     General: Bowel sounds are normal. There is no distension.     Palpations: Abdomen is soft. There is no mass.     Tenderness: There is no abdominal tenderness.  Musculoskeletal:        General: No tenderness. Normal range of motion.     Cervical back: Normal range of motion and neck supple.     Comments: Upper / Lower Extremities: - Normal muscle tone, strength bilateral upper extremities 5/5, lower extremities 5/5  Left knee Normal appearance. Except raised lesion directly over patellar tendon. Has localized distinct border palpable soft but firm borders approx 1.5 cm size mobile directly over tendon but not at all attached to tendon, easily mobile, has superficial scar.  Lymphadenopathy:     Cervical: No cervical adenopathy.  Skin:    General: Skin is warm and dry.     Findings: No erythema or rash.     Comments: Bilateral toenails appear normal and healthy and smooth, however there is increased pigmentation some bands vertically, consistent with melanonychia   Neurological:     Mental Status: She is alert and oriented to person, place, and time.     Comments: Distal sensation intact to light touch all extremities  Psychiatric:        Behavior: Behavior normal.     Comments: Well groomed, good eye contact, normal speech and thoughts    Results for orders placed or performed in visit on 04/18/20  Lipid panel  Result Value Ref Range   Cholesterol 261 (H) <200 mg/dL   HDL 53 > OR = 50 mg/dL   Triglycerides 124 <150 mg/dL   LDL Cholesterol (Calc) 182 (H) mg/dL (calc)   Total CHOL/HDL Ratio 4.9 <5.0 (calc)   Non-HDL Cholesterol (Calc) 208 (H) <130 mg/dL (calc)  COMPLETE METABOLIC PANEL WITH GFR  Result Value Ref Range   Glucose, Bld 98 65 - 99 mg/dL   BUN 12 7 - 25 mg/dL   Creat 0.75 0.50 - 1.10  mg/dL   GFR, Est Non  African American 96 > OR = 60 mL/min/1.27m2   GFR, Est African American 111 > OR = 60 mL/min/1.15m2   BUN/Creatinine Ratio NOT APPLICABLE 6 - 22 (calc)   Sodium 138 135 - 146 mmol/L   Potassium 4.4 3.5 - 5.3 mmol/L   Chloride 103 98 - 110 mmol/L   CO2 27 20 - 32 mmol/L   Calcium 9.6 8.6 - 10.2 mg/dL   Total Protein 7.0 6.1 - 8.1 g/dL   Albumin 4.6 3.6 - 5.1 g/dL   Globulin 2.4 1.9 - 3.7 g/dL (calc)   AG Ratio 1.9 1.0 - 2.5 (calc)   Total Bilirubin 0.5 0.2 - 1.2 mg/dL   Alkaline phosphatase (APISO) 72 31 - 125 U/L   AST 16 10 - 35 U/L   ALT 18 6 - 29 U/L  CBC with Differential/Platelet  Result Value Ref Range   WBC 6.7 3.8 - 10.8 Thousand/uL   RBC 4.65 3.80 - 5.10 Million/uL   Hemoglobin 14.0 11.7 - 15.5 g/dL   HCT 42.9 35 - 45 %   MCV 92.3 80.0 - 100.0 fL   MCH 30.1 27.0 - 33.0 pg   MCHC 32.6 32.0 - 36.0 g/dL   RDW 13.2 11.0 - 15.0 %   Platelets 292 140 - 400 Thousand/uL   MPV 10.5 7.5 - 12.5 fL   Neutro Abs 4,342 1,500 - 7,800 cells/uL   Lymphs Abs 1,628 850 - 3,900 cells/uL   Absolute Monocytes 395 200 - 950 cells/uL   Eosinophils Absolute 295 15 - 500 cells/uL   Basophils Absolute 40 0 - 200 cells/uL   Neutrophils Relative % 64.8 %   Total Lymphocyte 24.3 %   Monocytes Relative 5.9 %   Eosinophils Relative 4.4 %   Basophils Relative 0.6 %  Hemoglobin A1c  Result Value Ref Range   Hgb A1c MFr Bld 5.4 <5.7 % of total Hgb   Mean Plasma Glucose 108 (calc)   eAG (mmol/L) 6.0 (calc)      Assessment & Plan:   Problem List Items Addressed This Visit    None    Visit Diagnoses    Annual physical exam    -  Primary      Updated Health Maintenance information - She will schedule upcoming GYN apt, will get updated mammogram. Last done 2017. - Last pap 2019, next due in 2022. - Declines Cologuard or other colon CA screening. Will reconsider at age 85+ Reviewed recent lab results with patient  - Elevated LDL cholesterol, we discussed previous  history of statin failure on atorvastatin, rosuvastatin, pravastatin, also failed red rice yeast due to statin myalgia. Concern with her fam history of hyperlipidemia and early heart disease, she will consider possible referral to [facility], may warrant future advanced lipid panel, zetia vs PCSK9 trial as offered.  Encouraged improvement to lifestyle with diet and exercise Maintain healthy weight.   No orders of the defined types were placed in this encounter.    Follow up plan: Return in about 1 year (around 04/26/2021) for Annual Physical.  [name], DO [facility] 04/26/2020, 11:44 AM

## 2020-04-26 NOTE — Patient Instructions (Addendum)
Thank you for coming to the office today.  Tdap done 03/29/2013 - good for 10 years, next due 03/30/2023  We will work on cholesterol management.  Leg cramps - Try spoonful of yellow mustard to relieve leg cramps or try daily to prevent the problem  - OTC natural option is Hyland's Leg Cramps (Dissolving tablet) take as needed for muscle cramps  Can try a drink with electrolytes, sports drink type in morning with exercise.  Toenails - looks like normal melanin pigment.  Left knee, nodule is scar tissue most likely, it is above the patellar tendon. Superficial   Please schedule a Follow-up Appointment to: Return in about 1 year (around 04/26/2021) for Annual Physical.  If you have any other questions or concerns, please feel free to call the office or send a message through [place]. You may also schedule an earlier appointment if necessary.  Additionally, you may be receiving a survey about your experience at our office within a few days to 1 week by e-mail or mail. We value your feedback.  [name], DO [place]

## 2020-05-01 ENCOUNTER — Other Ambulatory Visit: Payer: Self-pay | Admitting: Family Medicine

## 2020-05-01 DIAGNOSIS — J3089 Other allergic rhinitis: Secondary | ICD-10-CM

## 2020-05-01 NOTE — Telephone Encounter (Signed)
Requested Prescriptions  Pending Prescriptions Disp Refills  . montelukast (SINGULAIR) 10 MG tablet [Pharmacy Med Name: MONTELUKAST SOD 10 MG TAB 10 Tablet] 90 tablet 3    Sig: TAKE 1 TABLET BY MOUTH AT BEDTIME.     Pulmonology:  Leukotriene Inhibitors Passed - 05/01/2020  9:00 AM      Passed - Valid encounter within last 12 months    Recent Outpatient Visits          5 days ago Annual physical exam   [place], DO   2 months ago Acute right-sided low back pain without sciatica   [place], [place]   1 year ago Annual physical exam   [place], DO   1 year ago Elevated hemoglobin A1c   [facility] Olin Hauser, DO   2 years ago Annual physical exam   [facility], Devonne Doughty, DO      Future Appointments            In 2 days Rubie Maid, MD [facility]   In 3 months Jerelene Redden, Janalyn Harder, NP [facility] [place]

## 2020-05-03 ENCOUNTER — Ambulatory Visit (INDEPENDENT_AMBULATORY_CARE_PROVIDER_SITE_OTHER): Payer: No Typology Code available for payment source | Admitting: Obstetrics and Gynecology

## 2020-05-03 ENCOUNTER — Other Ambulatory Visit: Payer: Self-pay | Admitting: Obstetrics and Gynecology

## 2020-05-03 ENCOUNTER — Encounter: Payer: Self-pay | Admitting: Obstetrics and Gynecology

## 2020-05-03 VITALS — BP 113/73 | HR 85 | Ht 60.0 in | Wt 101.4 lb

## 2020-05-03 DIAGNOSIS — Z01419 Encounter for gynecological examination (general) (routine) without abnormal findings: Secondary | ICD-10-CM

## 2020-05-03 DIAGNOSIS — N951 Menopausal and female climacteric states: Secondary | ICD-10-CM

## 2020-05-03 DIAGNOSIS — Z1231 Encounter for screening mammogram for malignant neoplasm of breast: Secondary | ICD-10-CM

## 2020-05-03 DIAGNOSIS — N393 Stress incontinence (female) (male): Secondary | ICD-10-CM | POA: Diagnosis not present

## 2020-05-03 DIAGNOSIS — Z1211 Encounter for screening for malignant neoplasm of colon: Secondary | ICD-10-CM

## 2020-05-03 MED ORDER — MEDROXYPROGESTERONE ACETATE 10 MG PO TABS: ORAL_TABLET | ORAL | 6 refills | Status: DC

## 2020-05-03 NOTE — Progress Notes (Signed)
Pt present for annual exam. Pt stated that she was doing well no problems.  

## 2020-05-03 NOTE — Progress Notes (Signed)
GYNECOLOGY ANNUAL PHYSICAL EXAM PROGRESS NOTE  Subjective:    Meredith Carpenter is a 49 y.o. G42P2002 married female who presents for an annual exam.The patient is sexually active. The patient wears seatbelts: yes. She participates in regular exercise: yes (4-5 times per week). Has the patient ever been transfused or tattooed?: no. The patient has no history of domestic violence in her life.    The patient has the following complaints today:  1. She reports occasional leaking of urine (small amounts) with exercise, or coughing, laughing. Notes that it has not been going on for very long.   Gynecologic History:  Patient's last menstrual period was 04/28/2020. Period Duration (Days): 5-6 Period Pattern: Regular Menstrual Flow: Heavy, Moderate Menstrual Control: Maxi pad Menstrual Control Change Freq (Hours): 2-3 Dysmenorrhea: (!) Moderate Dysmenorrhea Symptoms: (S) Cramping, Other (Comment) (lower back pain)  Menstrual History:  Contraception: condoms History of STI's: None Last Pap: 11/10/2017. Results were: normal.  Denies h/o abnormal pap smears. Last mammogram: 11/19/2017. Results were: normal   OB History  Gravida Para Term Preterm AB Living  2 2 2  0 0 2  SAB TAB Ectopic Multiple Live Births  0 0 0 0 2    # Outcome Date GA Lbr Len/2nd Weight Sex Delivery Anes PTL Lv  2 Term 05/01/06   5 lb 7 oz (2.466 kg) F Vag-Spont EPI N LIV     Name: Meredith Carpenter  1 Term 02/03/97   6 lb 13 oz (3.09 kg) M Vag-Spont EPI  LIV     Name: Meredith Carpenter    Past Medical History:  Diagnosis Date  . Asthma   . GERD (gastroesophageal reflux disease)   . Vitamin D deficiency     Past Surgical History:  Procedure Laterality Date  . ESOPHAGOGASTRODUODENOSCOPY  2014  . ESOPHAGOGASTRODUODENOSCOPY (EGD) WITH PROPOFOL N/A 01/25/2016   Procedure: ESOPHAGOGASTRODUODENOSCOPY (EGD) WITH PROPOFOL;  Surgeon: Lucilla Lame, MD;  Location: [facility];  Service: Endoscopy;  Laterality: N/A;  . TONSILLECTOMY  AND ADENOIDECTOMY  1979    Family History  Problem Relation Age of Onset  . Hyperlipidemia Mother   . Heart disease Mother   . Hypertension Mother   . Diabetes Mother   . Heart attack Mother 60  . Heart attack Father 11  . Hypertension Brother   . Diabetes Brother   . Heart disease Brother 46  . Heart attack Maternal Uncle   . Diabetes Sister   . Hypertension Sister   . Heart disease Sister 72  . Colon cancer Neg Hx   . Breast cancer Neg Hx     Social History   Socioeconomic History  . Marital status: Married    Spouse name: Not on file  . Number of children: 2  . Years of education: 74  . Highest education level: Not on file  Occupational History  . Occupation: Ship broker: [employer]  Tobacco Use  . Smoking status: Never Smoker  . Smokeless tobacco: Never Used  Vaping Use  . Vaping Use: Never used  Substance and Sexual Activity  . Alcohol use: No    Alcohol/week: 0.0 standard drinks  . Drug use: No  . Sexual activity: Yes    Partners: Male    Birth control/protection: Condom    Comment: 1 partner   Other Topics Concern  . Not on file  Social History Narrative   [name] grew up in [place]. She is married and has 2 children. 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   Social Determinants of Health   Financial Resource Strain:   . Difficulty of Paying Living Expenses:   Food Insecurity:   . Worried About Charity fundraiser in the Last Year:   . Arboriculturist in the Last Year:   Transportation Needs:   . Film/video editor (Medical):   Marland Kitchen Lack of Transportation (Non-Medical):   Physical Activity:   . Days of Exercise per Week:   . Minutes of Exercise per Session:   Stress:   . Feeling of Stress :   Social Connections:   . Frequency of Communication with Friends and Family:   . Frequency of Social Gatherings with Friends and Family:   . Attends Religious Services:   . Active Member of  Clubs or Organizations:   . Attends Archivist Meetings:   Marland Kitchen Marital Status:   Intimate Partner Violence:   . Fear of Current or Ex-Partner:   . Emotionally Abused:   Marland Kitchen Physically Abused:   . Sexually Abused:     Current Outpatient Medications on File Prior to Visit  Medication Sig Dispense Refill  . acyclovir (ZOVIRAX) 400 MG tablet Take 1 tablet (400 mg total) by mouth 5 (five) times daily. As needed 35 tablet 0  . albuterol (VENTOLIN HFA) 108 (90 Base) MCG/ACT inhaler Inhale 2 puffs into the lungs every 6 (six) hours as needed for wheezing or shortness of breath (cough). 8 g 2  . Cholecalciferol (VITAMIN D-3) 1000 units CAPS Take 1 capsule (1,000 Units total) by mouth daily. 60 capsule   . cyclobenzaprine (FLEXERIL) 10 MG tablet Take 0.5-1 tablets (5-10 mg total) by mouth daily as needed for muscle spasms. 30 tablet 2  . Docusate Calcium (STOOL SOFTENER PO) Take by mouth as needed.    Marland Kitchen esomeprazole (NEXIUM) 20 MG capsule TAKE 1 CAPSULE BY MOUTH 2 TIMES DAILY BEFORE A MEAL. 180 capsule 1  . ezetimibe (ZETIA) 10 MG tablet Take 1 tablet (10 mg total) by mouth daily. 30 tablet 2  . ferrous sulfate 325 (65 FE) MG tablet Take 1 tablet (325 mg total) by mouth daily with breakfast. 60 tablet 5  . fluticasone (FLONASE) 50 MCG/ACT nasal spray Place 2 sprays into both nostrils daily. 16 g 11  . medroxyPROGESTERone (PROVERA) 10 MG tablet TAKE 1 TABLET BY MOUTH DAILY. TAKE DAILY ON DAYS 14-28 OF CYCLE EACH MONTH. 28 tablet 3  . montelukast (SINGULAIR) 10 MG tablet TAKE 1 TABLET BY MOUTH AT BEDTIME. 90 tablet 3  . Multiple Vitamin (MULTIVITAMIN) capsule Take 1 capsule by mouth daily.    Marland Kitchen triamcinolone cream (KENALOG) 0.1 % Apply 1 application topically 2 (two) times daily as needed. Up to 2 weeks at a time. Use on neck and body, avoid use on face 30 g 2   No current facility-administered medications on file prior to visit.    No Known Allergies    Review of Systems Constitutional:  negative for chills, fatigue, fevers and sweats Eyes: negative for irritation, redness and visual disturbance Ears, nose, mouth, throat, and face: negative for hearing loss, nasal congestion, snoring and tinnitus Respiratory: negative for asthma, cough, sputum Cardiovascular: negative for chest pain, dyspnea, exertional chest pressure/discomfort, irregular heart beat, palpitations and syncope Gastrointestinal: negative for abdominal pain, change in bowel habits, nausea and vomiting Genitourinary: negative for abnormal menstrual periods, genital lesions, sexual problems and vaginal discharge, dysuria.  Positive for mild stress urinary  incontinence.  Integument/breast: negative for breast lump, breast tenderness and nipple discharge Hematologic/lymphatic: negative for bleeding and easy bruising Musculoskeletal:negative for back pain and muscle weakness Neurological: negative for dizziness, headaches, vertigo and weakness Endocrine: negative for diabetic symptoms including polydipsia, polyuria and skin dryness Allergic/Immunologic: negative for hay fever and urticaria       Objective:  Blood pressure 113/73, pulse 85, height 5' (1.524 m), weight 101 lb 6.4 oz (46 kg), last menstrual period 04/28/2020. Body mass index is 19.8 kg/m.  General Appearance:    Alert, cooperative, no distress, appears stated age  Head:    Normocephalic, without obvious abnormality, atraumatic  Eyes:    PERRL, conjunctiva/corneas clear, EOM's intact, both eyes  Ears:    Normal external ear canals, both ears  Nose:   Nares normal, septum midline, mucosa normal, no drainage or sinus tenderness  Throat:   Lips, mucosa, and tongue normal; teeth and gums normal  Neck:   Supple, symmetrical, trachea midline, no adenopathy; thyroid: no enlargement/tenderness/nodules; no carotid bruit or JVD  Back:     Symmetric, no curvature, ROM normal, no CVA tenderness  Lungs:     Clear to auscultation bilaterally, respirations unlabored   Chest Wall:    No tenderness or deformity   Heart:    Regular rate and rhythm, S1 and S2 normal, no murmur, rub or gallop  Breast Exam:    No tenderness, masses, or nipple abnormality  Abdomen:     Soft, non-tender, bowel sounds active all four quadrants, no masses, no organomegaly.    Genitalia:    Pelvic:external genitalia normal, vagina without lesions, discharge, or tenderness, rectovaginal septum  normal. Cervix normal in appearance, no cervical motion tenderness, no adnexal masses or tenderness.  Uterus normal size, shape, mobile, regular contours, nontender.  Rectal:    Normal external sphincter.  No hemorrhoids appreciated. Internal exam not done.   Extremities:   Extremities normal, atraumatic, no cyanosis or edema  Pulses:   2+ and symmetric all extremities  Skin:   Skin color, texture, turgor normal, no rashes or lesions  Lymph nodes:   Cervical, supraclavicular, and axillary nodes normal  Neurologic:   CNII-XII intact, normal strength, sensation and reflexes throughout   .  Labs:  Lab Results  Component Value Date   WBC 6.7 04/19/2020   HGB 14.0 04/19/2020   HCT 42.9 04/19/2020   MCV 92.3 04/19/2020   PLT 292 04/19/2020    Lab Results  Component Value Date   CREATININE 0.75 04/19/2020   BUN 12 04/19/2020   NA 138 04/19/2020   K 4.4 04/19/2020   CL 103 04/19/2020   CO2 27 04/19/2020    Lab Results  Component Value Date   ALT 18 04/19/2020   AST 16 04/19/2020   ALKPHOS 73 08/09/2018   BILITOT 0.5 04/19/2020    Lab Results  Component Value Date   TSH 3.05 08/09/2018     Assessment:   1. Women's annual routine gynecological examination   2. Breast cancer screening by mammogram   3. Colon cancer screening   4. Stress incontinence of urine   5. Perimenopausal     Plan:    Blood tests: None ordered.  Patient has labs performed with PCP. Breast self exam technique reviewed and patient encouraged to perform self-exam monthly. Contraception:  condoms. Discussed healthy lifestyle modifications. Mammogram ordered. Pap smear up to date.  Patient is perimenopausal. Has a history of irregular cycles, currently using progesterone in luteal phase.  Desires refill.  Discussion had with patient regarding mild symptoms of stress incontinence.  Encouraged on use of Kegel exercises, included handout in AVS.  Could also consider use of OTC Impressa vaginal inserts when more physically active. If symptoms worsen, could consider use of pessary or surgical intervention.  COVID vaccination status: Patient has completed COVID vaccination series (2 doses). See Epic for documentation. Follow up in 1 year for annual exam.    Upstream - 05/03/20 1527      Pregnancy Intention Screening   Does the patient want to become pregnant in the next year? No    Does the patient's partner want to become pregnant in the next year? No    Would the patient like to discuss contraceptive options today? No      Contraception Wrap Up   Current Method Female Condom          The pregnancy intention screening data noted above was reviewed. Potential methods of contraception were not further discussed. The patient elected to continue with Female Condom.      Rubie Maid, MD [facility]

## 2020-05-03 NOTE — Patient Instructions (Addendum)
Preventive Care 40-47 Years Old, Female °Preventive care refers to visits with your health care provider and lifestyle choices that can promote health and wellness. This includes: °· A yearly physical exam. This may also be called an annual well check. °· Regular dental visits and eye exams. °· Immunizations. °· Screening for certain conditions. °· Healthy lifestyle choices, such as eating a healthy diet, getting regular exercise, not using drugs or products that contain nicotine and tobacco, and limiting alcohol use. °What can I expect for my preventive care visit? °Physical exam °Your health care provider will check your: °· Height and weight. This may be used to calculate body mass index (BMI), which tells if you are at a healthy weight. °· Heart rate and blood pressure. °· Skin for abnormal spots. °Counseling °Your health care provider may ask you questions about your: °· Alcohol, tobacco, and drug use. °· Emotional well-being. °· Home and relationship well-being. °· Sexual activity. °· Eating habits. °· Work and work environment. °· Method of birth control. °· Menstrual cycle. °· Pregnancy history. °What immunizations do I need? ° °Influenza (flu) vaccine °· This is recommended every year. °Tetanus, diphtheria, and pertussis (Tdap) vaccine °· You may need a Td booster every 10 years. °Varicella (chickenpox) vaccine °· You may need this if you have not been vaccinated. °Zoster (shingles) vaccine °· You may need this after age 60. °Measles, mumps, and rubella (MMR) vaccine °· You may need at least one dose of MMR if you were born in 1957 or later. You may also need a second dose. °Pneumococcal conjugate (PCV13) vaccine °· You may need this if you have certain conditions and were not previously vaccinated. °Pneumococcal polysaccharide (PPSV23) vaccine °· You may need one or two doses if you smoke cigarettes or if you have certain conditions. °Meningococcal conjugate (MenACWY) vaccine °· You may need this if you  have certain conditions. °Hepatitis A vaccine °· You may need this if you have certain conditions or if you travel or work in places where you may be exposed to hepatitis A. °Hepatitis B vaccine °· You may need this if you have certain conditions or if you travel or work in places where you may be exposed to hepatitis B. °Haemophilus influenzae type b (Hib) vaccine °· You may need this if you have certain conditions. °Human papillomavirus (HPV) vaccine °· If recommended by your health care provider, you may need three doses over 6 months. °You may receive vaccines as individual doses or as more than one vaccine together in one shot (combination vaccines). Talk with your health care provider about the risks and benefits of combination vaccines. °What tests do I need? °Blood tests °· Lipid and cholesterol levels. These may be checked every 5 years, or more frequently if you are over 50 years old. °· Hepatitis C test. °· Hepatitis B test. °Screening °· Lung cancer screening. You may have this screening every year starting at age 55 if you have a 30-pack-year history of smoking and currently smoke or have quit within the past 15 years. °· Colorectal cancer screening. All adults should have this screening starting at age 50 and continuing until age 75. Your health care provider may recommend screening at age 45 if you are at increased risk. You will have tests every 1-10 years, depending on your results and the type of screening test. °· Diabetes screening. This is done by checking your blood sugar (glucose) after you have not eaten for a while (fasting). You may have this   done every 1-3 years.  Mammogram. This may be done every 1-2 years. Talk with your health care provider about when you should start having regular mammograms. This may depend on whether you have a family history of breast cancer.  BRCA-related cancer screening. This may be done if you have a family history of breast, ovarian, tubal, or peritoneal  cancers.  Pelvic exam and Pap test. This may be done every 3 years starting at age 21. Starting at age 30, this may be done every 5 years if you have a Pap test in combination with an HPV test. Other tests  Sexually transmitted disease (STD) testing.  Bone density scan. This is done to screen for osteoporosis. You may have this scan if you are at high risk for osteoporosis. Follow these instructions at home: Eating and drinking  Eat a diet that includes fresh fruits and vegetables, whole grains, lean protein, and low-fat dairy.  Take vitamin and mineral supplements as recommended by your health care provider.  Do not drink alcohol if: ? Your health care provider tells you not to drink. ? You are pregnant, may be pregnant, or are planning to become pregnant.  If you drink alcohol: ? Limit how much you have to 0-1 drink a day. ? Be aware of how much alcohol is in your drink. In the [place] one drink equals one 12 oz bottle of beer (355 mL), one 5 oz glass of wine (148 mL), or one 1 oz glass of hard liquor (44 mL). Lifestyle  Take daily care of your teeth and gums.  Stay active. Exercise for at least 30 minutes on 5 or more days each week.  Do not use any products that contain nicotine or tobacco, such as cigarettes, e-cigarettes, and chewing tobacco. If you need help quitting, ask your health care provider.  If you are sexually active, practice safe sex. Use a condom or other form of birth control (contraception) in order to prevent pregnancy and STIs (sexually transmitted infections).  If told by your health care provider, take low-dose aspirin daily starting at age 50. What's next?  Visit your health care provider once a year for a well check visit.  Ask your health care provider how often you should have your eyes and teeth checked.  Stay up to date on all vaccines. This information is not intended to replace advice given to you by your health care provider. Make sure you  discuss any questions you have with your health care provider. Document Revised: 05/27/2018 Document Reviewed: 05/27/2018 Elsevier Patient Education  2020 Elsevier Inc. Breast Self-Awareness Breast self-awareness is knowing how your breasts look and feel. Doing breast self-awareness is important. It allows you to catch a breast problem early while it is still small and can be treated. All women should do breast self-awareness, including women who have had breast implants. Tell your doctor if you notice a change in your breasts. What you need:  A mirror.  A well-lit room. How to do a breast self-exam A breast self-exam is one way to learn what is normal for your breasts and to check for changes. To do a breast self-exam: Look for changes  1. Take off all the clothes above your waist. 2. Stand in front of a mirror in a room with good lighting. 3. Put your hands on your hips. 4. Push your hands down. 5. Look at your breasts and nipples in the mirror to see if one breast or nipple looks different from the   other. Check to see if: ? The shape of one breast is different. ? The size of one breast is different. ? There are wrinkles, dips, and bumps in one breast and not the other. 6. Look at each breast for changes in the skin, such as: ? Redness. ? Scaly areas. 7. Look for changes in your nipples, such as: ? Liquid around the nipples. ? Bleeding. ? Dimpling. ? Redness. ? A change in where the nipples are. Feel for changes  1. Lie on your back on the floor. 2. Feel each breast. To do this, follow these steps: ? Pick a breast to feel. ? Put the arm closest to that breast above your head. ? Use your other arm to feel the nipple area of your breast. Feel the area with the pads of your three middle fingers by making small circles with your fingers. For the first circle, press lightly. For the second circle, press harder. For the third circle, press even harder. ? Keep making circles with  your fingers at the different pressures as you move down your breast. Stop when you feel your ribs. ? Move your fingers a little toward the center of your body. ? Start making circles with your fingers again, this time going up until you reach your collarbone. ? Keep making up-and-down circles until you reach your armpit. Remember to keep using the three pressures. ? Feel the other breast in the same way. 3. Sit or stand in the tub or shower. 4. With soapy water on your skin, feel each breast the same way you did in step 2 when you were lying on the floor. Write down what you find Writing down what you find can help you remember what to tell your doctor. Write down:  What is normal for each breast.  Any changes you find in each breast, including: ? The kind of changes you find. ? Whether you have pain. ? Size and location of any lumps.  When you last had your menstrual period. General tips  Check your breasts every month.  If you are breastfeeding, the best time to check your breasts is after you feed your baby or after you use a breast pump.  If you get menstrual periods, the best time to check your breasts is 5-7 days after your menstrual period is over.  With time, you will become comfortable with the self-exam, and you will begin to know if there are changes in your breasts. Contact a doctor if you:  See a change in the shape or size of your breasts or nipples.  See a change in the skin of your breast or nipples, such as red or scaly skin.  Have fluid coming from your nipples that is not normal.  Find a lump or thick area that was not there before.  Have pain in your breasts.  Have any concerns about your breast health. Summary  Breast self-awareness includes looking for changes in your breasts, as well as feeling for changes within your breasts.  Breast self-awareness should be done in front of a mirror in a well-lit room.  You should check your breasts every month.  If you get menstrual periods, the best time to check your breasts is 5-7 days after your menstrual period is over.  Let your doctor know of any changes you see in your breasts, including changes in size, changes on the skin, pain or tenderness, or fluid from your nipples that is not normal. This information is not   intended to replace advice given to you by your health care provider. Make sure you discuss any questions you have with your health care provider. Document Revised: 05/04/2018 Document Reviewed: 05/04/2018 Elsevier Patient Education  2020 Elsevier Inc.      Kegel Exercises  Kegel exercises can help strengthen your pelvic floor muscles. The pelvic floor is a group of muscles that support your rectum, small intestine, and bladder. In females, pelvic floor muscles also help support the womb (uterus). These muscles help you control the flow of urine and stool. Kegel exercises are painless and simple, and they do not require any equipment. Your provider may suggest Kegel exercises to:  Improve bladder and bowel control.  Improve sexual response.  Improve weak pelvic floor muscles after surgery to remove the uterus (hysterectomy) or pregnancy (females).  Improve weak pelvic floor muscles after prostate gland removal or surgery (males). Kegel exercises involve squeezing your pelvic floor muscles, which are the same muscles you squeeze when you try to stop the flow of urine or keep from passing gas. The exercises can be done while sitting, standing, or lying down, but it is best to vary your position. Exercises How to do Kegel exercises: 1. Squeeze your pelvic floor muscles tight. You should feel a tight lift in your rectal area. If you are a female, you should also feel a tightness in your vaginal area. Keep your stomach, buttocks, and legs relaxed. 2. Hold the muscles tight for up to 10 seconds. 3. Breathe normally. 4. Relax your muscles. 5. Repeat as told by your health care  provider. Repeat this exercise daily as told by your health care provider. Continue to do this exercise for at least 4-6 weeks, or for as long as told by your health care provider. You may be referred to a physical therapist who can help you learn more about how to do Kegel exercises. Depending on your condition, your health care provider may recommend:  Varying how long you squeeze your muscles.  Doing several sets of exercises every day.  Doing exercises for several weeks.  Making Kegel exercises a part of your regular exercise routine. This information is not intended to replace advice given to you by your health care provider. Make sure you discuss any questions you have with your health care provider. Document Revised: 05/05/2018 Document Reviewed: 05/05/2018 Elsevier Patient Education  2020 Elsevier Inc.  

## 2020-05-06 ENCOUNTER — Encounter: Payer: Self-pay | Admitting: Obstetrics and Gynecology

## 2020-05-10 ENCOUNTER — Other Ambulatory Visit: Payer: Self-pay

## 2020-05-10 ENCOUNTER — Ambulatory Visit
Admission: RE | Admit: 2020-05-10 | Discharge: 2020-05-10 | Disposition: A | Discharge status: Home / Self Care | Payer: No Typology Code available for payment source | Source: Ambulatory Visit | Attending: Obstetrics and Gynecology | Admitting: Obstetrics and Gynecology

## 2020-05-10 DIAGNOSIS — Z1231 Encounter for screening mammogram for malignant neoplasm of breast: Secondary | ICD-10-CM | POA: Diagnosis not present

## 2020-05-17 ENCOUNTER — Ambulatory Visit: Payer: No Typology Code available for payment source | Admitting: Nurse Practitioner

## 2020-07-02 ENCOUNTER — Other Ambulatory Visit: Payer: Self-pay | Admitting: Family Medicine

## 2020-07-02 DIAGNOSIS — Z9289 Personal history of other medical treatment: Secondary | ICD-10-CM

## 2020-07-02 DIAGNOSIS — Z111 Encounter for screening for respiratory tuberculosis: Secondary | ICD-10-CM

## 2020-08-07 ENCOUNTER — Telehealth: Payer: Self-pay

## 2020-08-07 NOTE — Telephone Encounter (Signed)
Completed PA on [email or web address] for patients medication Nexium.   Awaiting outcome to be FAXED to the office.  Thank you.  Meredith Carpenter

## 2020-08-14 ENCOUNTER — Other Ambulatory Visit: Payer: Self-pay

## 2020-08-16 ENCOUNTER — Telehealth: Payer: Self-pay | Admitting: Internal Medicine

## 2020-08-16 ENCOUNTER — Other Ambulatory Visit: Payer: Self-pay | Admitting: Internal Medicine

## 2020-08-16 ENCOUNTER — Other Ambulatory Visit: Payer: Self-pay

## 2020-08-16 ENCOUNTER — Ambulatory Visit (INDEPENDENT_AMBULATORY_CARE_PROVIDER_SITE_OTHER): Payer: No Typology Code available for payment source | Admitting: Nurse Practitioner

## 2020-08-16 ENCOUNTER — Encounter: Payer: Self-pay | Admitting: Nurse Practitioner

## 2020-08-16 ENCOUNTER — Encounter: Payer: Self-pay | Admitting: Internal Medicine

## 2020-08-16 ENCOUNTER — Ambulatory Visit (INDEPENDENT_AMBULATORY_CARE_PROVIDER_SITE_OTHER): Payer: No Typology Code available for payment source | Admitting: Internal Medicine

## 2020-08-16 VITALS — BP 95/66 | HR 72 | Ht 60.0 in | Wt 104.2 lb

## 2020-08-16 VITALS — BP 110/74 | HR 80 | Temp 98.6°F | Ht 60.0 in | Wt 100.8 lb

## 2020-08-16 DIAGNOSIS — M791 Myalgia, unspecified site: Secondary | ICD-10-CM | POA: Diagnosis not present

## 2020-08-16 DIAGNOSIS — D1724 Benign lipomatous neoplasm of skin and subcutaneous tissue of left leg: Secondary | ICD-10-CM

## 2020-08-16 DIAGNOSIS — L509 Urticaria, unspecified: Secondary | ICD-10-CM | POA: Diagnosis not present

## 2020-08-16 DIAGNOSIS — E7849 Other hyperlipidemia: Secondary | ICD-10-CM

## 2020-08-16 DIAGNOSIS — Z01812 Encounter for preprocedural laboratory examination: Secondary | ICD-10-CM | POA: Diagnosis not present

## 2020-08-16 DIAGNOSIS — E782 Mixed hyperlipidemia: Secondary | ICD-10-CM | POA: Diagnosis not present

## 2020-08-16 DIAGNOSIS — R072 Precordial pain: Secondary | ICD-10-CM

## 2020-08-16 DIAGNOSIS — T466X5A Adverse effect of antihyperlipidemic and antiarteriosclerotic drugs, initial encounter: Secondary | ICD-10-CM

## 2020-08-16 MED ORDER — METOPROLOL TARTRATE 100 MG PO TABS: ORAL_TABLET | ORAL | 0 refills | Status: DC

## 2020-08-16 MED ORDER — REPATHA SURECLICK 140 MG/ML ~~LOC~~ SOAJ: 1.0000 | SUBCUTANEOUS | 11 refills | Status: DC

## 2020-08-16 NOTE — Telephone Encounter (Signed)
PA for repatha sureclick submitted via CMM (Key: [id number])

## 2020-08-16 NOTE — Patient Instructions (Addendum)
Medication Instructions:  Dr. Debara Pickett recommends Repatha 195m/mL (PCSK9). This is an injectable cholesterol medication self-administered once every 14 days. This medication will likely need prior approval with your insurance company, which we will work on. If the medication is not approved initially, we may need to do an appeal with your insurance. We will keep you updated on this process.   Administer medication in area of fatty tissue such as abdomen, outer thigh, back up of arm - and rotate site with each injection Store medication in refrigerator until ready to administer - allow to sit at room temp for 30 mins - 1 hour prior to injection Dispose of medication in a SHARPS container - your pharmacy should be able to direct you on this and proper disposal   If you need co-pay assistance grant, please look into the program at healthwellfoundation.org >> disease funds >> hypercholesterolemia. This is an online application or you can call to complete. Once approved, you will provide the "pharmacy card" information to your pharmacy and they will deduct the co-pays from this grant.  If you need a co-pay card for Repatha: rhttp://aguilar-moyer.[email or web address]> paying for Repatha or red box that says "Repatha Copay Card" in top right If you need a co-pay card for Praluent: pWedMap.it>> starting & paying for Praluent  *If you need a refill on your cardiac medications before your next appointment, please call your pharmacy*   Lab Work: BMET prior to CT test  FASTING lipid panel in 3-4 months to check cholesterol - complete about 1 week before your next visit with Dr. HDebara Pickett If you have labs (blood work) drawn today and your tests are completely normal, you will receive your results only by: .Marland KitchenMyChart Message (if you have MyChart) OR . A paper copy in the mail If you have any lab test that is abnormal or we need to change your treatment, we will call you to review the results.   Testing/Procedures[facility] you will be called to schedule this test once approved   Follow-Up: [facility] and your health needs are our priority.  As part of our continuing mission to provide you with exceptional heart care, we have created designated Provider Care Teams.  These Care Teams include your primary Cardiologist (physician) and Advanced Practice Providers (APPs -  Physician Assistants and Nurse Practitioners) who all work together to provide you with the care you need, when you need it.  We recommend signing up for the patient portal called "MyChart".  Sign up information is provided on this After Visit Summary.  MyChart is used to connect with patients for Virtual Visits (Telemedicine).  Patients are able to view lab/test results, encounter notes, upcoming appointments, etc.  Non-urgent messages can be sent to your provider as well.   To learn more about what you can do with MyChart, go to [email or web address]    Your next appointment:   3-4 month(s) - lipid clinic  The format for your next appointment:   In Person  Provider:   K. CMaliHilty, MD   You have been referred to Dr. SLattie Corns(geneticist)    Other Instructions Your cardiac CT will be scheduled at one of the below locations:   [facility] [phone] [place] [phone] [facility] please arrive at the  [facility] main entrance of [facility] 30 minutes prior to test start time. Proceed to the [facility] Radiology Department (first floor) to check-in and test prep.  If scheduled at [facility], please arrive 15 mins early for check-in and test prep.  Please follow these instructions carefully (unless otherwise directed):  On the Night Before the Test: . Be sure to Drink plenty of water. . Do  not consume any caffeinated/decaffeinated beverages or chocolate 12 hours prior to your test. . Do not take any antihistamines 12 hours prior to your test.  On the Day of the Test: . Drink plenty of water. Do not drink any water within one hour of the test. . Do not eat any food 4 hours prior to the test. . You may take your regular medications prior to the test.  . Take metoprolol (Lopressor) two hours prior to test. . HOLD Furosemide/Hydrochlorothiazide morning of the test. . FEMALES- please wear underwire-free bra if available  After the Test: . Drink plenty of water. . After receiving IV contrast, you may experience a mild flushed feeling. This is normal. . On occasion, you may experience a mild rash up to 24 hours after the test. This is not dangerous. If this occurs, you can take Benadryl 25 mg and increase your fluid intake. . If you experience trouble breathing, this can be serious. If it is severe call 911 IMMEDIATELY. If it is mild, please call our office. . If you take any of these medications: Glipizide/Metformin, Avandament, Glucavance, please do not take 48 hours after completing test unless otherwise instructed.   Once we have confirmed authorization from your insurance company, we will call you to set up a date and time for your test. Based on how quickly your insurance processes prior authorizations requests, please allow up to 4 weeks to be contacted for scheduling your Cardiac CT appointment. Be advised that routine Cardiac CT appointments could be scheduled as many as 8 weeks after your provider has ordered it.  For non-scheduling related questions, please contact the cardiac imaging nurse navigator should you have any questions/concerns: Marchia Bond, Cardiac Imaging Nurse Navigator Burley Saver, Interim Cardiac Imaging Nurse [place] [facility] Direct Office Dial: [phone]   For scheduling needs, including cancellations and rescheduling,  please call [place], [phone] (temporary number).

## 2020-08-16 NOTE — Progress Notes (Signed)
New Patient Office Visit  Subjective:  Patient ID: [facility], female    DOB: 1971/04/17  Age: 49 y.o. MRN: [id number]  CC:  Chief Complaint  Patient presents with  . New Patient (Initial Visit)    establish care    HPI Meredith Carpenter is a 49 year old with history of mild intermittent asthma, allergic rhinitis, GERD, hives of unknown origin, vitamin D deficiency, mixed hyperlipidemia, history of anemia  presents to establish care with primary care provider at [facility] location.  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    Mixed hyperlipidemia: FH of heart disease, cholesterol issues, and is followed by Dr. Lyman Bishop in cardiology.  She  is on Zetia 10 mg daily and a request has been made to start her Repatha- pending insurance approval.   A cardiac CT study has been ordered..   Lab Results  Component Value Date   CHOL 202 (H) 08/16/2020   HDL 47.10 08/16/2020   LDLCALC 182 (H) 04/19/2020   LDLDIRECT 142.0 08/16/2020   TRIG 202.0 (H) 08/16/2020   CHOLHDL 4 08/16/2020    Hives of unknown origin: She has developed hives on her face of 1 to 2-year duration that she thinks occurs whenever she gets stressed.  She treats this with Zyrtec 10 mg daily and it quickly resolves.  She has had no angioedema, chest wheezing, or anaphylaxis type symptoms.  She has not seen an allergist.  She does not take the Zyrtec on a regular basis.  Patient brings in a picture on her cell phone of the rash, and it does have hives, with blisters.  Left knee fatty lipoma: Patient  recalls that she lived in [place] as a child and fell and cut her left knee.  It bled quite a bit and she wondered if that was the cause for her knee bump now. She has a likely lipoma on her anterior knee and it has been present for many years. It typically does not bother her except if she gets on her hands and knees to mop the floor.  She has considered getting it removed,.   Past  Medical History:  Diagnosis Date  . Asthma   . GERD (gastroesophageal reflux disease)   . Vitamin D deficiency     Past Surgical History:  Procedure Laterality Date  . ESOPHAGOGASTRODUODENOSCOPY  2014  . ESOPHAGOGASTRODUODENOSCOPY (EGD) WITH PROPOFOL N/A 01/25/2016   Procedure: ESOPHAGOGASTRODUODENOSCOPY (EGD) WITH PROPOFOL;  Surgeon: Lucilla Lame, MD;  Location: [facility];  Service: Endoscopy;  Laterality: N/A;  . TONSILLECTOMY AND ADENOIDECTOMY  1979    Family History  Problem Relation Age of Onset  . Hyperlipidemia Mother   . Heart disease Mother   . Hypertension Mother   . Diabetes Mother   . Heart attack Mother 65  . Heart attack Father 63  . Hypertension Brother   . Diabetes Brother   . Heart disease Brother 62  . Heart attack Maternal Uncle   . Diabetes Sister   . Hypertension Sister   . Heart disease Sister 26  . Colon cancer Neg Hx   . Breast cancer Neg Hx     Social History   Socioeconomic History  . Marital status: Married    Spouse name: Not on file  . Number of children: 2  . Years of education: 55  . Highest education level: Not on file  Occupational History  . Occupation: Psychologist, sport and exercise  Employer: [employer]  Tobacco Use  . Smoking status: Never Smoker  . Smokeless tobacco: Never Used  Vaping Use  . Vaping Use: Never used  Substance and Sexual Activity  . Alcohol use: No    Alcohol/week: 0.0 standard drinks  . Drug use: No  . Sexual activity: Yes    Partners: Male    Birth control/protection: Condom    Comment: 1 partner   Other Topics Concern  . Not on file  Social History Narrative   Meredith Carpenter grew up in [place]. She is married and has 2 children. 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   Social Determinants of Health   Financial Resource Strain:   . Difficulty of Paying Living Expenses: Not on file  Food Insecurity:   . Worried About Charity fundraiser in the Last Year: Not on  file  . Ran Out of Food in the Last Year: Not on file  Transportation Needs:   . Lack of Transportation (Medical): Not on file  . Lack of Transportation (Non-Medical): Not on file  Physical Activity:   . Days of Exercise per Week: Not on file  . Minutes of Exercise per Session: Not on file  Stress:   . Feeling of Stress : Not on file  Social Connections:   . Frequency of Communication with Friends and Family: Not on file  . Frequency of Social Gatherings with Friends and Family: Not on file  . Attends Religious Services: Not on file  . Active Member of Clubs or Organizations: Not on file  . Attends Archivist Meetings: Not on file  . Marital Status: Not on file  Intimate Partner Violence:   . Fear of Current or Ex-Partner: Not on file  . Emotionally Abused: Not on file  . Physically Abused: Not on file  . Sexually Abused: Not on file    Review of Systems  Constitutional: Negative.   HENT: Negative.   Eyes: Negative.   Respiratory: Negative.   Cardiovascular: Negative.   Gastrointestinal: Negative.   Endocrine: Negative.   Genitourinary: Negative.   Musculoskeletal: Negative.   Skin:       Lipoma like lesion on anterior left knee   Allergic/Immunologic:       Facial hives with stress  Hematological: Negative.   Psychiatric/Behavioral: Negative.        No concerns about depression/anxiety.     Objective:   Today's Vitals: BP 110/74 (BP Location: Left Arm, Patient Position: Sitting, Cuff Size: Normal)   Pulse 80   Temp 98.6 F (37 C) (Oral)   Ht 5' (1.524 m)   Wt 100 lb 12.8 oz (45.7 kg)   SpO2 99%   BMI 19.69 kg/m   Physical Exam Vitals reviewed.  Constitutional:      Appearance: She is normal weight.  HENT:     Head: Normocephalic and atraumatic.  Eyes:     Conjunctiva/sclera: Conjunctivae normal.     Pupils: Pupils are equal, round, and reactive to light.  Cardiovascular:     Rate and Rhythm: Normal rate and regular rhythm.     Pulses:  Normal pulses.     Heart sounds: Normal heart sounds.  Pulmonary:     Effort: Pulmonary effort is normal.     Breath sounds: Normal breath sounds.  Abdominal:     Palpations: Abdomen is soft.     Tenderness: There is no abdominal tenderness.  Musculoskeletal:  General: Normal range of motion.     Cervical back: Normal range of motion and neck supple.     Comments: Suspected  lipoma left anterior knee. Superficial.  Non-tender. Knee with normal ROM.   Skin:    General: Skin is warm and dry.  Neurological:     General: No focal deficit present.     Mental Status: She is alert and oriented to person, place, and time.  Psychiatric:        Mood and Affect: Mood normal.        Behavior: Behavior normal.     Assessment & Plan:   Problem List Items Addressed This Visit      Musculoskeletal and Integument   Hives of unknown origin   Relevant Orders   Ambulatory referral to Allergy     Other   Mixed hyperlipidemia - Primary   Relevant Orders   Comprehensive metabolic panel (Completed)   Hemoglobin A1c (Completed)   Lipid panel (Completed)    Other Visit Diagnoses    Lipoma of left lower extremity       Relevant Orders   Ambulatory referral to Orthopedic Surgery      Outpatient Encounter Medications as of 08/16/2020  Medication Sig  . acyclovir (ZOVIRAX) 400 MG tablet Take 1 tablet (400 mg total) by mouth 5 (five) times daily. As needed  . albuterol (VENTOLIN HFA) 108 (90 Base) MCG/ACT inhaler Inhale 2 puffs into the lungs every 6 (six) hours as needed for wheezing or shortness of breath (cough).  . Cholecalciferol (VITAMIN D-3) 1000 units CAPS Take 1 capsule (1,000 Units total) by mouth daily.  . cyclobenzaprine (FLEXERIL) 10 MG tablet Take 0.5-1 tablets (5-10 mg total) by mouth daily as needed for muscle spasms.  [name] Calcium (STOOL SOFTENER PO) Take by mouth as needed.  Marland Kitchen esomeprazole (NEXIUM) 20 MG capsule TAKE 1 CAPSULE BY MOUTH 2 TIMES DAILY BEFORE A MEAL.   Marland Kitchen Evolocumab (REPATHA SURECLICK) 109 MG/ML SOAJ Inject 1 Dose into the skin every 14 (fourteen) days.  Marland Kitchen ezetimibe (ZETIA) 10 MG tablet Take 1 tablet (10 mg total) by mouth daily.  . ferrous sulfate 325 (65 FE) MG tablet Take 1 tablet (325 mg total) by mouth daily with breakfast.  . fluticasone (FLONASE) 50 MCG/ACT nasal spray Place 2 sprays into both nostrils daily.  . medroxyPROGESTERone (PROVERA) 10 MG tablet TAKE 1 TABLET BY MOUTH DAILY. TAKE DAILY ON DAYS 14-28 OF CYCLE EACH MONTH.  . metoprolol tartrate (LOPRESSOR) 100 MG tablet Take ONE tablet two hours prior to test.  . montelukast (SINGULAIR) 10 MG tablet TAKE 1 TABLET BY MOUTH AT BEDTIME.  . Multiple Vitamin (MULTIVITAMIN) capsule Take 1 capsule by mouth daily.  Marland Kitchen triamcinolone cream (KENALOG) 0.1 % Apply 1 application topically 2 (two) times daily as needed. Up to 2 weeks at a time. Use on neck and body, avoid use on face   No facility-administered encounter medications on file as of 08/16/2020.   Labs today to follow up on Zetia- new start this summer.   Hives on face maybe related to  stress- relieved with Zyrtec . Please take Zyrtec 10 mg every day to try to  prevent.  If hives involve mouth, lips, throat- seek emergency room care.   Please upload your cell phone photo of your hives with blisters to My Chart.  See Dr. Donneta Romberg in Allergy to help identity the cause.   Please arrange a follow up with primary care in 4 months.  Follow-up: Return in about 4 months (around 12/14/2020).   This visit occurred during the SARS-CoV-2 public health emergency.  Safety protocols were in place, including screening questions prior to the visit, additional usage of staff PPE, and extensive cleaning of exam room while observing appropriate contact time as indicated for disinfecting solutions.    Denice Paradise, NP

## 2020-08-16 NOTE — Patient Instructions (Addendum)
Labs today to follow up on Zetia- new start this summer.   Hives on face maybe related to  stress- relieved with Zyrtec . Please take Zyrtec 10 mg every day to try to  prevent.  If hives involve mouth, lips, throat- seek emergency room care.   Please upload your cell phone photo of your hives with blisters to My Chart.  See Dr. Donneta Romberg in Allergy to help identity the cause.   Please arrange a follow up with primary care in 4 months.    Dyslipidemia Dyslipidemia is an imbalance of waxy, fat-like substances (lipids) in the blood. The body needs lipids in small amounts. Dyslipidemia often involves a high level of cholesterol or triglycerides, which are types of lipids. Common forms of dyslipidemia include:  High levels of LDL cholesterol. LDL is the type of cholesterol that causes fatty deposits (plaques) to build up in the blood vessels that carry blood away from your heart (arteries).  Low levels of HDL cholesterol. HDL cholesterol is the type of cholesterol that protects against heart disease. High levels of HDL remove the LDL buildup from arteries.  High levels of triglycerides. Triglycerides are a fatty substance in the blood that is linked to a buildup of plaques in the arteries. What are the causes? Primary dyslipidemia is caused by changes (mutations) in genes that are passed down through families (inherited). These mutations cause several types of dyslipidemia. Secondary dyslipidemia is caused by lifestyle choices and diseases that lead to dyslipidemia, such as:  Eating a diet that is high in animal fat.  Not getting enough exercise.  Having diabetes, kidney disease, liver disease, or thyroid disease.  Drinking large amounts of alcohol.  Using certain medicines. What increases the risk? You are more likely to develop this condition if you are an older man or if you are a woman who has gone through menopause. Other risk factors include:  Having a family history of  dyslipidemia.  Taking certain medicines, including birth control pills, steroids, some diuretics, and beta-blockers.  Smoking cigarettes.  Eating a high-fat diet.  Having certain medical conditions such as diabetes, polycystic ovary syndrome (PCOS), kidney disease, liver disease, or hypothyroidism.  Not exercising regularly.  Being overweight or obese with too much belly fat. What are the signs or symptoms? In most cases, dyslipidemia does not usually cause any symptoms. In severe cases, very high lipid levels can cause:  Fatty bumps under the skin (xanthomas).  White or gray ring around the black center (pupil) of the eye. Very high triglyceride levels can cause inflammation of the pancreas (pancreatitis). How is this diagnosed? Your health care provider may diagnose dyslipidemia based on a routine blood test (fasting blood test). Because most people do not have symptoms of the condition, this blood testing (lipid profile) is done on adults age 47 and older and is repeated every 5 years. This test checks:  Total cholesterol. This measures the total amount of cholesterol in your blood, including LDL cholesterol, HDL cholesterol, and triglycerides. A healthy number is below 200.  LDL cholesterol. The target number for LDL cholesterol is different for each person, depending on individual risk factors. Ask your health care provider what your LDL cholesterol should be.  HDL cholesterol. An HDL level of 60 or higher is best because it helps to protect against heart disease. A number below 47 for men or below 47 for women increases the risk for heart disease.  Triglycerides. A healthy triglyceride number is below 150. If your lipid profile  is abnormal, your health care provider may do other blood tests. How is this treated? Treatment depends on the type of dyslipidemia that you have and your other risk factors for heart disease and stroke. Your health care provider will have a target range  for your lipid levels based on this information. For many people, this condition may be treated by lifestyle changes, such as diet and exercise. Your health care provider may recommend that you:  Get regular exercise.  Make changes to your diet.  Quit smoking if you smoke. If diet changes and exercise do not help you reach your goals, your health care provider may also prescribe medicine to lower lipids. The most commonly prescribed type of medicine lowers your LDL cholesterol (statin drug). If you have a high triglyceride level, your provider may prescribe another type of drug (fibrate) or an omega-3 fish oil supplement, or both. Follow these instructions at home:  Eating and drinking  Follow instructions from your health care provider or dietitian about eating or drinking restrictions.  Eat a healthy diet as told by your health care provider. This can help you reach and maintain a healthy weight, lower your LDL cholesterol, and raise your HDL cholesterol. This may include: ? Limiting your calories, if you are overweight. ? Eating more fruits, vegetables, whole grains, fish, and lean meats. ? Limiting saturated fat, trans fat, and cholesterol.  If you drink alcohol: ? Limit how much you use. ? Be aware of how much alcohol is in your drink. In the [place] one drink equals one 12 oz bottle of beer (355 mL), one 5 oz glass of wine (148 mL), or one 1 oz glass of hard liquor (44 mL).  Do not drink alcohol if: ? Your health care provider tells you not to drink. ? You are pregnant, may be pregnant, or are planning to become pregnant. Activity  Get regular exercise. Start an exercise and strength training program as told by your health care provider. Ask your health care provider what activities are safe for you. Your health care provider may recommend: ? 30 minutes of aerobic activity 4-6 days a week. Brisk walking is an example of aerobic activity. ? Strength training 2 days a week. General  instructions  Do not use any products that contain nicotine or tobacco, such as cigarettes, e-cigarettes, and chewing tobacco. If you need help quitting, ask your health care provider.  Take over-the-counter and prescription medicines only as told by your health care provider. This includes supplements.  Keep all follow-up visits as told by your health care provider. Contact a health care provider if:  You are: ? Having trouble sticking to your exercise or diet plan. ? Struggling to quit smoking or control your use of alcohol. Summary  Dyslipidemia often involves a high level of cholesterol or triglycerides, which are types of lipids.  Treatment depends on the type of dyslipidemia that you have and your other risk factors for heart disease and stroke.  For many people, treatment starts with lifestyle changes, such as diet and exercise.  Your health care provider may prescribe medicine to lower lipids. This information is not intended to replace advice given to you by your health care provider. Make sure you discuss any questions you have with your health care provider. Document Revised: 05/10/2018 Document Reviewed: 04/16/2018 Elsevier Patient Education  [place] (urticaria) are itchy, red, swollen areas on the skin. Hives can appear on any part of the body. Hives often  fade within 24 hours (acute hives). Sometimes, new hives appear after old ones fade and the cycle can continue for several days or weeks (chronic hives). Hives do not spread from person to person (are not contagious). Hives come from the body's reaction to something a person is allergic to (allergen), something that causes irritation, or various other triggers. When a person is exposed to a trigger, his or her body releases a chemical (histamine) that causes redness, itching, and swelling. Hives can appear right after exposure to a trigger or hours later. What are the causes? This condition may be  caused by:  Allergies to foods or ingredients.  Insect bites or stings.  Exposure to pollen or pets.  Contact with latex or chemicals.  Spending time in sunlight, heat, or cold (exposure).  Exercise.  Stress.  Certain medicines. You can also get hives from other medical conditions and treatments, such as:  Viruses, including the common cold.  Bacterial infections, such as urinary tract infections and strep throat.  Certain medicines.  Allergy shots.  Blood transfusions. Sometimes, the cause of this condition is not known (idiopathic hives). What increases the risk? You are more likely to develop this condition if you:  Are a woman.  Have food allergies, especially to citrus fruits, milk, eggs, peanuts, tree nuts, or shellfish.  Are allergic to: ? Medicines. ? Latex. ? Insects. ? Animals. ? Pollen. What are the signs or symptoms? Common symptoms of this condition include raised, itchy, red or white bumps or patches on your skin. These areas may:  Become large and swollen (welts).  Change in shape and location, quickly and repeatedly.  Be separate hives or connect over a large area of skin.  Sting or become painful.  Turn white when pressed in the center (blanch). In severe cases, yourhands, feet, and face may also become swollen. This may occur if hives develop deeper in your skin. How is this diagnosed? This condition may be diagnosed by your symptoms, medical history, and physical exam.  Your skin, urine, or blood may be tested to find out what is causing your hives and to rule out other health issues.  Your health care provider may also remove a small sample of skin from the affected area and examine it under a microscope (biopsy). How is this treated? Treatment for this condition depends on the cause and severity of your symptoms. Your health care provider may recommend using cool, wet cloths (cool compresses) or taking cool showers to relieve  itching. Treatment may include:  Medicines that help: ? Relieve itching (antihistamines). ? Reduce swelling (corticosteroids). ? Treat infection (antibiotics).  An injectable medicine (omalizumab). Your health care provider may prescribe this if you have chronic idiopathic hives and you continue to have symptoms even after treatment with antihistamines. Severe cases may require an emergency injection of adrenaline (epinephrine) to prevent a life-threatening allergic reaction (anaphylaxis). Follow these instructions at home: Medicines  Take and apply over-the-counter and prescription medicines only as told by your health care provider.  If you were prescribed an antibiotic medicine, take it as told by your health care provider. Do not stop using the antibiotic even if you start to feel better. Skin care  Apply cool compresses to the affected areas.  Do not scratch or rub your skin. General instructions  Do not take hot showers or baths. This can make itching worse.  Do not wear tight-fitting clothing.  Use sunscreen and wear protective clothing when you are outside.  Avoid any  substances that cause your hives. Keep a journal to help track what causes your hives. Write down: ? What medicines you take. ? What you eat and drink. ? What products you use on your skin.  Keep all follow-up visits as told by your health care provider. This is important. Contact a health care provider if:  Your symptoms are not controlled with medicine.  Your joints are painful or swollen. Get help right away if:  You have a fever.  You have pain in your abdomen.  Your tongue or lips are swollen.  Your eyelids are swollen.  Your chest or throat feels tight.  You have trouble breathing or swallowing. These symptoms may represent a serious problem that is an emergency. Do not wait to see if the symptoms will go away. Get medical help right away. Call your local emergency services (911 in the  [place] Do not drive yourself to the hospital. Summary  Hives (urticaria) are itchy, red, swollen areas on your skin. Hives come from the body's reaction to something a person is allergic to (allergen), something that causes irritation, or various other triggers.  Treatment for this condition depends on the cause and severity of your symptoms.  Avoid any substances that cause your hives. Keep a journal to help track what causes your hives.  Take and apply over-the-counter and prescription medicines only as told by your health care provider.  Keep all follow-up visits as told by your health care provider. This is important. This information is not intended to replace advice given to you by your health care provider. Make sure you discuss any questions you have with your health care provider. Document Revised: 03/31/2018 Document Reviewed: 03/31/2018 Elsevier Patient Education  [place].

## 2020-08-16 NOTE — Progress Notes (Signed)
LIPID CLINIC CONSULT NOTE  Chief Complaint:  Manage dyslipidemia  Primary Care Physician: Meredith Hauser, DO  Primary Cardiologist:  No primary care provider on file.  HPI:  Meredith Carpenter is a 49 y.o. female who is being seen today for the evaluation of dyslipidemia at the request of Meredith Carpenter *. This is a very pleasant [name] female who is kindly referred for evaluation and management of dyslipidemia.  She reports a longstanding history of high cholesterol and unfortunately has had a statin intolerance.  She is previously been treated with rosuvastatin and atorvastatin both of which caused severe myalgias.  Her family history significant for heart disease in her father and his brothers all of which who had early MI and her mother who had an MI as well however I suspect the high cholesterol is particularly on her father side.  She is noted that her cholesterols been high since her 30s.  More recently she had lipid testing off therapy indicating total cholesterol 261, triglycerides 124, HDL 53 and LDL 182.  Based on this her PCP has started her on ezetimibe.  Although her LDL is not quite 190, this is very suspicious for a familial hyperlipidemia.  In addition recently she told me she has been having some chest discomfort.  This, sometimes with exercise or can be at rest and is described more as a sharp pain that is worse with taking a deep breath or associated with exertional movements.  She has few other cardiovascular risk factors other than family history and very high cholesterol, noting low blood pressure, no history of diabetes or other associated diseases.  She exercises fairly regularly.  She reports her traditional [place] diet with more spicy foods and some saturated fat intake.  PMHx:  Past Medical History:  Diagnosis Date  . Asthma   . GERD (gastroesophageal reflux disease)   . Vitamin D deficiency     Past Surgical History:  Procedure Laterality  Date  . ESOPHAGOGASTRODUODENOSCOPY  2014  . ESOPHAGOGASTRODUODENOSCOPY (EGD) WITH PROPOFOL N/A 01/25/2016   Procedure: ESOPHAGOGASTRODUODENOSCOPY (EGD) WITH PROPOFOL;  Surgeon: Lucilla Lame, MD;  Location: [facility];  Service: Endoscopy;  Laterality: N/A;  . TONSILLECTOMY AND ADENOIDECTOMY  1979    FAMHx:  Family History  Problem Relation Age of Onset  . Hyperlipidemia Mother   . Heart disease Mother   . Hypertension Mother   . Diabetes Mother   . Heart attack Mother 81  . Heart attack Father 42  . Hypertension Brother   . Diabetes Brother   . Heart disease Brother 17  . Heart attack Maternal Uncle   . Diabetes Sister   . Hypertension Sister   . Heart disease Sister 3  . Colon cancer Neg Hx   . Breast cancer Neg Hx     SOCHx:   reports that she has never smoked. She has never used smokeless tobacco. She reports that she does not drink alcohol and does not use drugs.  ALLERGIES:  Allergies  Allergen Reactions  . Atorvastatin     myalgias  . Crestor [Rosuvastatin]     myalgias    ROS: Pertinent items noted in HPI and remainder of comprehensive ROS otherwise negative.  HOME MEDS: Current Outpatient Medications on File Prior to Visit  Medication Sig Dispense Refill  . acyclovir (ZOVIRAX) 400 MG tablet Take 1 tablet (400 mg total) by mouth 5 (five) times daily. As needed 35 tablet 0  . albuterol (VENTOLIN HFA) 108 (90  Base) MCG/ACT inhaler Inhale 2 puffs into the lungs every 6 (six) hours as needed for wheezing or shortness of breath (cough). 8 g 2  . Cholecalciferol (VITAMIN D-3) 1000 units CAPS Take 1 capsule (1,000 Units total) by mouth daily. 60 capsule   . cyclobenzaprine (FLEXERIL) 10 MG tablet Take 0.5-1 tablets (5-10 mg total) by mouth daily as needed for muscle spasms. 30 tablet 2  . Docusate Calcium (STOOL SOFTENER PO) Take by mouth as needed.    Marland Kitchen esomeprazole (NEXIUM) 20 MG capsule TAKE 1 CAPSULE BY MOUTH 2 TIMES DAILY BEFORE A MEAL. 180 capsule 1  .  ezetimibe (ZETIA) 10 MG tablet Take 1 tablet (10 mg total) by mouth daily. 30 tablet 2  . ferrous sulfate 325 (65 FE) MG tablet Take 1 tablet (325 mg total) by mouth daily with breakfast. 60 tablet 5  . fluticasone (FLONASE) 50 MCG/ACT nasal spray Place 2 sprays into both nostrils daily. 16 g 11  . medroxyPROGESTERone (PROVERA) 10 MG tablet TAKE 1 TABLET BY MOUTH DAILY. TAKE DAILY ON DAYS 14-28 OF CYCLE EACH MONTH. 28 tablet 6  . montelukast (SINGULAIR) 10 MG tablet TAKE 1 TABLET BY MOUTH AT BEDTIME. 90 tablet 3  . Multiple Vitamin (MULTIVITAMIN) capsule Take 1 capsule by mouth daily.    Marland Kitchen triamcinolone cream (KENALOG) 0.1 % Apply 1 application topically 2 (two) times daily as needed. Up to 2 weeks at a time. Use on neck and body, avoid use on face 30 g 2   No current facility-administered medications on file prior to visit.    LABS/IMAGING: No results found for this or any previous visit (from the past 48 hour(s)). No results found.  LIPID PANEL:    Component Value Date/Time   CHOL 261 (H) 04/19/2020 0825   TRIG 124 04/19/2020 0825   HDL 53 04/19/2020 0825   CHOLHDL 4.9 04/19/2020 0825   VLDL 29 04/16/2017 0817   LDLCALC 182 (H) 04/19/2020 0825   LDLDIRECT 160.0 03/12/2016 1430    WEIGHTS: Wt Readings from Last 3 Encounters:  08/16/20 104 lb 3.2 oz (47.3 kg)  05/03/20 101 lb 6.4 oz (46 kg)  04/26/20 101 lb 6.4 oz (46 kg)    VITALS: BP 95/66   Pulse 72   Ht 5' (1.524 m)   Wt 104 lb 3.2 oz (47.3 kg)   SpO2 100%   BMI 20.35 kg/m   EXAM: General appearance: alert and no distress Neck: no carotid bruit, no JVD and thyroid not enlarged, symmetric, no tenderness/mass/nodules Lungs: clear to auscultation bilaterally Heart: regular rate and rhythm, S1, S2 normal, no murmur, click, rub or gallop Abdomen: soft, non-tender; bowel sounds normal; no masses,  no organomegaly Extremities: extremities normal, atraumatic, no cyanosis or edema Pulses: 2+ and symmetric Skin: Skin  color, texture, turgor normal. No rashes or lesions Neurologic: Grossly normal Psych: Pleasant  EKG:  Deferred  ASSESSMENT: 1. Possible FH  2. History of premature coronary disease in her father and uncles and her mother 58. Statin intolerance-myalgias 4. Atypical chest pain  PLAN: 1.   Meredith Carpenter has possible familial hyperlipidemia but there is certainly a strong family history of early heart disease mostly on her father side but also her mother had an MI.  She has significant dyslipidemia which has been high most of her life despite activity and dietary modifications and being of normal weight.  It does seem she has few other cardiovascular risk factors.  She is also been describing some chest discomfort.  This is more of a sharp pain which sounds somewhat pleuritic and less likely to be cardiac however given her high cholesterol I think should not be ignored.  I would recommend a CT coronary angiogram to further evaluate and this would give [place] some idea of her vascular age and wrist at this point as well.  Because she could not tolerate high potency statin therapy, I think she is a candidate for PCSK9 inhibitor.  She had been started on ezetimibe however we could not expect to get more than 20 to 25% reduction in LDL and this.  She will likely need more to target LDL less than 100 based on her strong family history or if she has known coronary disease a goal less than 8.  We will proceed with prior authorization for Repatha.  I would also like her to consider genetic testing since she has children who would be at increased risk for FH as well if she is determined to be genetically positive.  Thanks again for this interesting referral.  Pixie Casino, MD, FACC, [place] Director of the [facility]  [facility] Diplomate of the American Board of Clinical Lipidology Attending Cardiologist  Direct Dial: [phone]   Fax: [phone]  Website:  [email or web address] [email or web address] Demitrious Mccannon 08/16/2020, 12:52 PM

## 2020-08-17 ENCOUNTER — Telehealth: Payer: Self-pay | Admitting: Nurse Practitioner

## 2020-08-17 LAB — COMPREHENSIVE METABOLIC PANEL
ALT: 18 U/L (ref 0–35)
AST: 15 U/L (ref 0–37)
Albumin: 4.5 g/dL (ref 3.5–5.2)
Alkaline Phosphatase: 61 U/L (ref 39–117)
BUN: 12 mg/dL (ref 6–23)
CO2: 26 mEq/L (ref 19–32)
Calcium: 9.7 mg/dL (ref 8.4–10.5)
Chloride: 102 mEq/L (ref 96–112)
Creatinine, Ser: 0.69 mg/dL (ref 0.40–1.20)
GFR: 103.48 mL/min (ref >60.00)
Glucose, Bld: 85 mg/dL (ref 70–99)
Potassium: 4.2 mEq/L (ref 3.5–5.1)
Sodium: 137 mEq/L (ref 135–145)
Total Bilirubin: 0.4 mg/dL (ref 0.2–1.2)
Total Protein: 7.2 g/dL (ref 6.0–8.3)

## 2020-08-17 LAB — LIPID PANEL
Cholesterol: 202 mg/dL — ABNORMAL HIGH (ref 0–200)
HDL: 47.1 mg/dL (ref >39.00)
NonHDL: 154.52
Total CHOL/HDL Ratio: 4
Triglycerides: 202 mg/dL — ABNORMAL HIGH (ref 0.0–149.0)
VLDL: 40.4 mg/dL — ABNORMAL HIGH (ref 0.0–40.0)

## 2020-08-17 LAB — HEMOGLOBIN A1C: Hgb A1c MFr Bld: 5.5 % (ref 4.6–6.5)

## 2020-08-17 LAB — LDL CHOLESTEROL, DIRECT: Direct LDL: 142 mg/dL

## 2020-08-17 NOTE — Telephone Encounter (Signed)
Patient was seen yesterday by Meredith Carpenter. Patient called and would like a referral to surgeon.

## 2020-08-18 ENCOUNTER — Encounter: Payer: Self-pay | Admitting: Nurse Practitioner

## 2020-08-18 DIAGNOSIS — L509 Urticaria, unspecified: Secondary | ICD-10-CM | POA: Insufficient documentation

## 2020-08-19 NOTE — Telephone Encounter (Signed)
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

## 2020-08-20 ENCOUNTER — Telehealth: Payer: Self-pay | Admitting: Internal Medicine

## 2020-08-20 ENCOUNTER — Other Ambulatory Visit: Payer: Self-pay | Admitting: Internal Medicine

## 2020-08-20 DIAGNOSIS — E7849 Other hyperlipidemia: Secondary | ICD-10-CM

## 2020-08-20 DIAGNOSIS — E782 Mixed hyperlipidemia: Secondary | ICD-10-CM

## 2020-08-20 MED ORDER — EZETIMIBE 10 MG PO TABS: 10.0000 mg | ORAL_TABLET | Freq: Every day | ORAL | 2 refills | Status: DC

## 2020-08-20 NOTE — Telephone Encounter (Signed)
Spoke with pt, refill for zetia sent to the pharmacy. She reports she received a my chart message regarding the repatha but she has questions. Will forward to [place], dr hilty's nurse to reach out to the patient.

## 2020-08-20 NOTE — Telephone Encounter (Signed)
New Message:       Pt wants to know if she can continue to take Zetia until she see see Dr Debara Pickett in March? If so, she says she will need a 90 days prescriptions for it please.

## 2020-08-20 NOTE — Telephone Encounter (Signed)
Returned call to patient. Explained how to obtain co-pay card. She also states she is going to be working part-time in 2022 and changing insurance to [employer] - explained we will need to do a new prior auth once she is on the new plan.

## 2020-08-20 NOTE — Telephone Encounter (Signed)
Patient aware referral was placed.  

## 2020-08-20 NOTE — Telephone Encounter (Signed)
The request has been approved. The authorization is effective for a maximum of 12 fills from 08/16/2020 to 08/15/2021, as long as the member is enrolled in their current health plan. The request was approved as submitted. A written notification letter will follow with additional details  Patient notified via [place]

## 2020-08-27 NOTE — Telephone Encounter (Signed)
Should not have bleeding on injection site other than a small stop on puncture site, unless close to a capillary and never received reports of pain unless given in the muscle and not on the fatty tissue.  Repatha is a water based solution and NOT oily.   We can schedule DEMO for next dose. Another alternative is using repatha Pushtronix instead.

## 2020-08-27 NOTE — Telephone Encounter (Signed)
Patient calling in regards to [employer]. She is requesting that someone call her to go over what [place] meant in the message. Please call/advise  Thank you!

## 2020-08-28 NOTE — Telephone Encounter (Signed)
Spoke with patient about Repatha concerns. She states she is needle-phobic and will faint at the sight of her or her kids blood. She noticed some discomfort with the liquid going into skin - injected into thigh (she reports not much fatty tissue). 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 She is unable to drive to [facility] for injection demo, as she lives in [place]. She will call back if she has any further issues

## 2020-09-04 ENCOUNTER — Telehealth (HOSPITAL_COMMUNITY): Payer: Self-pay | Admitting: *Deleted

## 2020-09-04 NOTE — Telephone Encounter (Signed)

## 2020-09-06 ENCOUNTER — Encounter: Payer: No Typology Code available for payment source | Admitting: Genetic Counselor

## 2020-09-06 ENCOUNTER — Ambulatory Visit
Admission: RE | Admit: 2020-09-06 | Discharge: 2020-09-06 | Disposition: A | Discharge status: Home / Self Care | Payer: No Typology Code available for payment source | Source: Ambulatory Visit | Attending: Internal Medicine | Admitting: Internal Medicine

## 2020-09-06 ENCOUNTER — Other Ambulatory Visit: Payer: Self-pay

## 2020-09-06 DIAGNOSIS — R072 Precordial pain: Secondary | ICD-10-CM | POA: Insufficient documentation

## 2020-09-06 DIAGNOSIS — E7849 Other hyperlipidemia: Secondary | ICD-10-CM | POA: Diagnosis present

## 2020-09-06 DIAGNOSIS — R2240 Localized swelling, mass and lump, unspecified lower limb: Secondary | ICD-10-CM | POA: Insufficient documentation

## 2020-09-06 MED ORDER — DILTIAZEM HCL 25 MG/5ML IV SOLN
10.0000 mg | Freq: Once | INTRAVENOUS | Status: AC
  Administered 2020-09-06: 10 mg via INTRAVENOUS

## 2020-09-06 MED ORDER — METOPROLOL TARTRATE 5 MG/5ML IV SOLN
10.0000 mg | Freq: Once | INTRAVENOUS | Status: AC
  Administered 2020-09-06: 10 mg via INTRAVENOUS

## 2020-09-06 MED ORDER — IOHEXOL 350 MG/ML SOLN
75.0000 mL | Freq: Once | INTRAVENOUS | Status: AC | PRN
  Administered 2020-09-06: 75 mL via INTRAVENOUS

## 2020-09-06 MED ORDER — NITROGLYCERIN 0.4 MG SL SUBL: 0.8000 mg | SUBLINGUAL_TABLET | Freq: Once | SUBLINGUAL | Status: DC

## 2020-09-06 MED ORDER — NITROGLYCERIN 0.4 MG SL SUBL
0.4000 mg | SUBLINGUAL_TABLET | Freq: Once | SUBLINGUAL | Status: AC
  Administered 2020-09-06: 0.4 mg via SUBLINGUAL

## 2020-09-06 NOTE — Progress Notes (Signed)
Patient tolerated CT well. Developed a headache after 4/10 throbbing. Drank water sitting in chair headache decreased 0/10. Vital signs stable encourage to drink water throughout day.Reasons explained and verbalized understanding. Ambulated steady gait.

## 2020-09-06 NOTE — Telephone Encounter (Signed)
Give her samples of Lo-Loestrin

## 2020-09-13 NOTE — Telephone Encounter (Signed)
Pt had her sister come in and pick up her samples. The sisters name was Meredith Carpenter. She verified the pts dated of birth.

## 2020-09-20 ENCOUNTER — Other Ambulatory Visit: Payer: Self-pay | Admitting: Specialist

## 2020-11-15 DIAGNOSIS — E7849 Other hyperlipidemia: Secondary | ICD-10-CM

## 2020-11-22 ENCOUNTER — Telehealth: Payer: Self-pay | Admitting: Internal Medicine

## 2020-11-22 NOTE — Telephone Encounter (Signed)
Approved: Effective from 11/22/2020 through 11/21/2021

## 2020-11-22 NOTE — Telephone Encounter (Signed)
Patient changed insurance plans New PA submitted via CMM (Key: [id number])  Questions in PA form: NO--Genetic confirmation of one mutant allele at the LDLR, Apo-B, PCSK9, or 1/LDLRAP1 gene  NO--History of pretreatment low-density lipoprotein hypercholesterolemia (LDL-C) level >190 mg/dl (>4.9 mmol/L)  NO--Clinical manifestations of HeFH (e.g. cutaneous xanthomas, tendon xanthomas, arcus cornea, tuberous xanthoma, or xanthelasma)  YES --"possible" familial hypercholesterolemia as defined by the Baruch Merl criteria  NO--[facility] Criteria score of greater than 5  YES--LDL-C level >100 mg/dL after treatment with antihyperlipidemic agents but prior to PCSK9 inhibitor therapy   Statin intolerant

## 2020-11-29 ENCOUNTER — Other Ambulatory Visit: Payer: Self-pay

## 2020-11-29 DIAGNOSIS — E785 Hyperlipidemia, unspecified: Secondary | ICD-10-CM

## 2020-11-29 NOTE — Progress Notes (Signed)
Here for lab draw from cardiologist

## 2020-11-30 LAB — LIPID PANEL
Chol/HDL Ratio: 2.1 ratio (ref 0.0–4.4)
Cholesterol, Total: 147 mg/dL (ref 100–199)
HDL: 69 mg/dL (ref >39)
LDL Chol Calc (NIH): 61 mg/dL (ref 0–99)
Triglycerides: 92 mg/dL (ref 0–149)
VLDL Cholesterol Cal: 17 mg/dL (ref 5–40)

## 2020-12-05 ENCOUNTER — Encounter: Payer: Self-pay | Admitting: Internal Medicine

## 2020-12-05 ENCOUNTER — Other Ambulatory Visit: Payer: Self-pay

## 2020-12-05 ENCOUNTER — Ambulatory Visit (INDEPENDENT_AMBULATORY_CARE_PROVIDER_SITE_OTHER): Payer: BC Managed Care – PPO | Admitting: Internal Medicine

## 2020-12-05 VITALS — BP 98/68 | HR 92 | Ht 60.0 in | Wt 99.2 lb

## 2020-12-05 DIAGNOSIS — T466X5D Adverse effect of antihyperlipidemic and antiarteriosclerotic drugs, subsequent encounter: Secondary | ICD-10-CM

## 2020-12-05 DIAGNOSIS — M791 Myalgia, unspecified site: Secondary | ICD-10-CM

## 2020-12-05 DIAGNOSIS — E7849 Other hyperlipidemia: Secondary | ICD-10-CM | POA: Diagnosis not present

## 2020-12-05 NOTE — Patient Instructions (Signed)
Medication Instructions:  Your physician recommends that you continue on your current medications as directed. Please refer to the Current Medication list given to you today.  *If you need a refill on your cardiac medications before your next appointment, please call your pharmacy*   Lab Work: FASTING lab work in 1 year to check cholesterol   If you have labs (blood work) drawn today and your tests are completely normal, you will receive your results only by: Marland Kitchen MyChart Message (if you have MyChart) OR . A paper copy in the mail If you have any lab test that is abnormal or we need to change your treatment, we will call you to review the results.   Testing/Procedures: NONE   Follow-Up: At [facility], you and your health needs are our priority.  As part of our continuing mission to provide you with exceptional heart care, we have created designated Provider Care Teams.  These Care Teams include your primary Cardiologist (physician) and Advanced Practice Providers (APPs -  Physician Assistants and Nurse Practitioners) who all work together to provide you with the care you need, when you need it.  We recommend signing up for the patient portal called "MyChart".  Sign up information is provided on this After Visit Summary.  MyChart is used to connect with patients for Virtual Visits (Telemedicine).  Patients are able to view lab/test results, encounter notes, upcoming appointments, etc.  Non-urgent messages can be sent to your provider as well.   To learn more about what you can do with MyChart, go to [email or web address].    Your next appointment:   12 month(s) - lipid clinic  The format for your next appointment:   In Person  Provider:   K. Mali Hilty, MD   Other Instructions

## 2020-12-05 NOTE — Progress Notes (Signed)
LIPID CLINIC CONSULT NOTE  Chief Complaint:  Follow-up dyslipidemia  Primary Care Physician: Marval Regal, NP  Primary Cardiologist:  No primary care provider on file.  HPI:  Meredith Carpenter is a 50 y.o. female who is being seen today for the evaluation of dyslipidemia at the request of [name] *. This is a very pleasant [place] female who is kindly referred for evaluation and management of dyslipidemia.  She reports a longstanding history of high cholesterol and unfortunately has had a statin intolerance.  She is previously been treated with rosuvastatin and atorvastatin both of which caused severe myalgias.  Her family history significant for heart disease in her father and his brothers all of which who had early MI and her mother who had an MI as well however I suspect the high cholesterol is particularly on her father side.  She is noted that her cholesterols been high since her 30s.  More recently she had lipid testing off therapy indicating total cholesterol 261, triglycerides 124, HDL 53 and LDL 182.  Based on this her PCP has started her on ezetimibe.  Although her LDL is not quite 190, this is very suspicious for a familial hyperlipidemia.  In addition recently she told me she has been having some chest discomfort.  This, sometimes with exercise or can be at rest and is described more as a sharp pain that is worse with taking a deep breath or associated with exertional movements.  She has few other cardiovascular risk factors other than family history and very high cholesterol, noting low blood pressure, no history of diabetes or other associated diseases.  She exercises fairly regularly.  She reports her traditional [place] diet with more spicy foods and some saturated fat intake.  12/05/2020  Meredith Carpenter returns today for follow-up of dyslipidemia.  She also underwent CT coronary angiography on 09/06/2020, demonstrating 0 coronary calcium and no evidence of  coronary disease.  No significant extracardiac findings were noted.  Unfortunately, she could not tolerate statin therapy due to side effects but has been able to tolerate Repatha.  Repeat lipids were performed 6 days ago which showed marked improvement in her numbers.  Total cholesterol is now 147, triglycerides 92 down from 202, HDL 69 and an LDL of 61.  Overall a very favorable lipid profile and again therapy has been very well-tolerated.  I did feel that although her LDL was not quite 190, that her lipid profile was very suspicious for a familial hyperlipidemia.  She is also recently lost more weight and continues to eat a low saturated fat diet.  PMHx:  Past Medical History:  Diagnosis Date  . Asthma   . GERD (gastroesophageal reflux disease)   . Vitamin D deficiency     Past Surgical History:  Procedure Laterality Date  . ESOPHAGOGASTRODUODENOSCOPY  2014  . ESOPHAGOGASTRODUODENOSCOPY (EGD) WITH PROPOFOL N/A 01/25/2016   Procedure: ESOPHAGOGASTRODUODENOSCOPY (EGD) WITH PROPOFOL;  Surgeon: Lucilla Lame, MD;  Location: [facility];  Service: Endoscopy;  Laterality: N/A;  . TONSILLECTOMY AND ADENOIDECTOMY  1979    FAMHx:  Family History  Problem Relation Age of Onset  . Hyperlipidemia Mother   . Heart disease Mother   . Hypertension Mother   . Diabetes Mother   . Heart attack Mother 74  . Heart attack Father 11  . Hypertension Brother   . Diabetes Brother   . Heart disease Brother 16  . Heart attack Maternal Uncle   . Diabetes Sister   .  Hypertension Sister   . Heart disease Sister 7  . Colon cancer Neg Hx   . Breast cancer Neg Hx     SOCHx:   reports that she has never smoked. She has never used smokeless tobacco. She reports that she does not drink alcohol and does not use drugs.  ALLERGIES:  Allergies  Allergen Reactions  . Atorvastatin     myalgias  . Crestor [Rosuvastatin]     myalgias    ROS: Pertinent items noted in HPI and remainder of comprehensive  ROS otherwise negative.  HOME MEDS: Current Outpatient Medications on File Prior to Visit  Medication Sig Dispense Refill  . acyclovir (ZOVIRAX) 400 MG tablet Take 1 tablet (400 mg total) by mouth 5 (five) times daily. As needed 35 tablet 0  . albuterol (VENTOLIN HFA) 108 (90 Base) MCG/ACT inhaler Inhale 2 puffs into the lungs every 6 (six) hours as needed for wheezing or shortness of breath (cough). 8 g 2  . Cholecalciferol (VITAMIN D-3) 1000 units CAPS Take 1 capsule (1,000 Units total) by mouth daily. 60 capsule   . cyclobenzaprine (FLEXERIL) 10 MG tablet Take 0.5-1 tablets (5-10 mg total) by mouth daily as needed for muscle spasms. 30 tablet 2  . Docusate Calcium (STOOL SOFTENER PO) Take by mouth as needed.    Marland Kitchen esomeprazole (NEXIUM) 20 MG capsule TAKE 1 CAPSULE BY MOUTH 2 TIMES DAILY BEFORE A MEAL. 180 capsule 1  . Evolocumab (REPATHA SURECLICK) 151 MG/ML SOAJ Inject 1 Dose into the skin every 14 (fourteen) days. 2 mL 11  . ezetimibe (ZETIA) 10 MG tablet Take 1 tablet (10 mg total) by mouth daily. 30 tablet 2  . ferrous sulfate 325 (65 FE) MG tablet Take 1 tablet (325 mg total) by mouth daily with breakfast. 60 tablet 5  . fluticasone (FLONASE) 50 MCG/ACT nasal spray Place 2 sprays into both nostrils daily. 16 g 11  . medroxyPROGESTERone (PROVERA) 10 MG tablet TAKE 1 TABLET BY MOUTH DAILY. TAKE DAILY ON DAYS 14-28 OF CYCLE EACH MONTH. 28 tablet 6  . montelukast (SINGULAIR) 10 MG tablet TAKE 1 TABLET BY MOUTH AT BEDTIME. 90 tablet 3  . Multiple Vitamin (MULTIVITAMIN) capsule Take 1 capsule by mouth daily.    Marland Kitchen triamcinolone cream (KENALOG) 0.1 % Apply 1 application topically 2 (two) times daily as needed. Up to 2 weeks at a time. Use on neck and body, avoid use on face 30 g 2   No current facility-administered medications on file prior to visit.    LABS/IMAGING: No results found for this or any previous visit (from the past 48 hour(s)). No results found.  LIPID PANEL:    Component  Value Date/Time   CHOL 147 11/29/2020 0805   TRIG 92 11/29/2020 0805   HDL 69 11/29/2020 0805   CHOLHDL 2.1 11/29/2020 0805   CHOLHDL 4 08/16/2020 1549   VLDL 40.4 (H) 08/16/2020 1549   LDLCALC 61 11/29/2020 0805   LDLCALC 182 (H) 04/19/2020 0825   LDLDIRECT 142.0 08/16/2020 1549    WEIGHTS: Wt Readings from Last 3 Encounters:  12/05/20 99 lb 3.2 oz (45 kg)  08/16/20 100 lb 12.8 oz (45.7 kg)  08/16/20 104 lb 3.2 oz (47.3 kg)    VITALS: BP 98/68 (BP Location: Left Arm, Patient Position: Sitting)   Pulse 92   Ht 5' (1.524 m)   Wt 99 lb 3.2 oz (45 kg)   SpO2 99%   BMI 19.37 kg/m   EXAM: Deferred  EKG:  Deferred  ASSESSMENT: 1. Possible FH  2. History of premature coronary disease in her father and uncles and her mother 72. Statin intolerance-myalgias 4. Atypical chest pain  PLAN: 1.   Meredith Carpenter fortunately was not found to have any cardiovascular disease by CT coronary angiography.  This is encouraging and now that her cholesterol is very well treated on a PCSK9 inhibitor hopefully we can continue to prevent cardiovascular disease in her.  She may very well have a familial hyperlipidemia.  We did discuss genetic testing, however she wishes to defer that at this time.  We will plan to repeat lipid profile in with me at that time.  Pixie Casino, MD, [facility], [place] Director of the Advanced Lipid Disorders &  Cardiovascular Risk Reduction Clinic Diplomate of the American Board of Clinical Lipidology Attending Cardiologist  Direct Dial: [phone]  Fax: [phone]  Website:  [email or web address] Osgood Vallery Mcdade 12/05/2020, 9:06 AM

## 2021-01-04 IMAGING — MG DIGITAL SCREENING BILAT W/ TOMO W/ CAD
8 series · 9 of 24 positions shown · non-contrast
Comparison: Previous exam(s).

CLINICAL DATA: Screening.

EXAM:
DIGITAL SCREENING BILATERAL MAMMOGRAM WITH TOMO AND CAD

[L CC synth-2D]
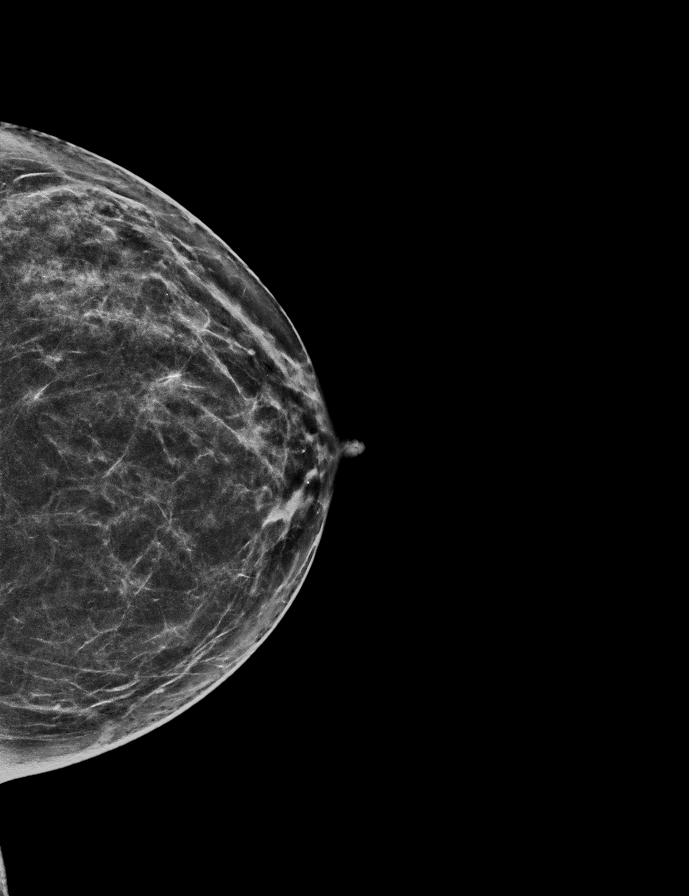

[R CC synth-2D]
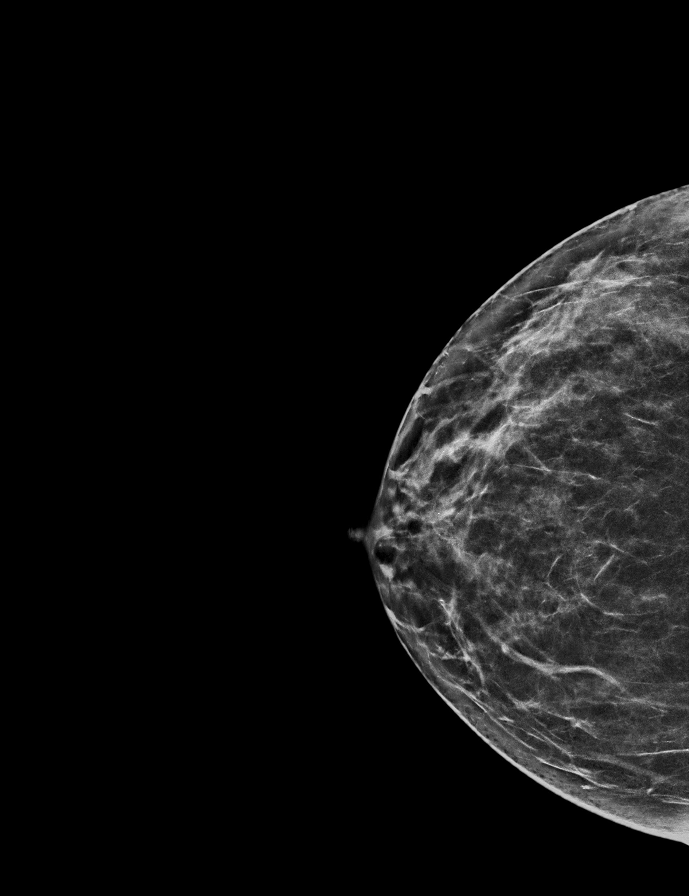

[L MLO synth-2D]
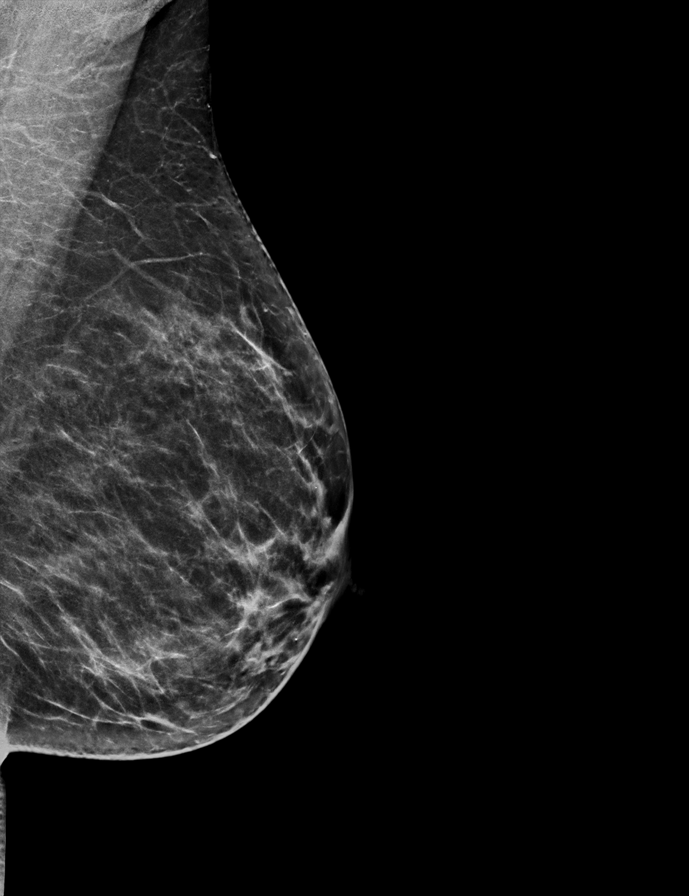

[R MLO synth-2D]
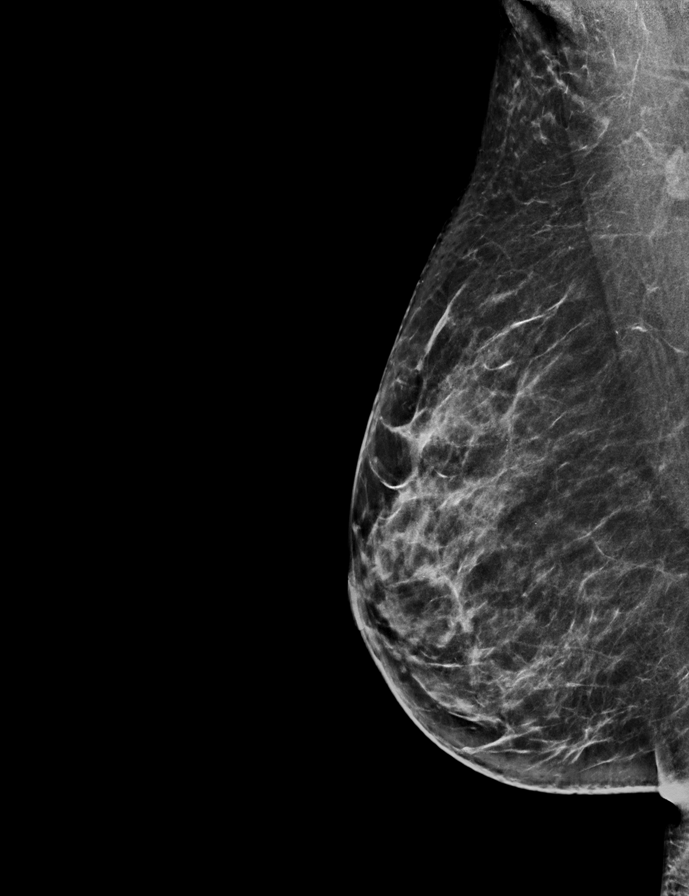

[L MLO tomo · 2 of 66 frames shown]
[frame 22/66]
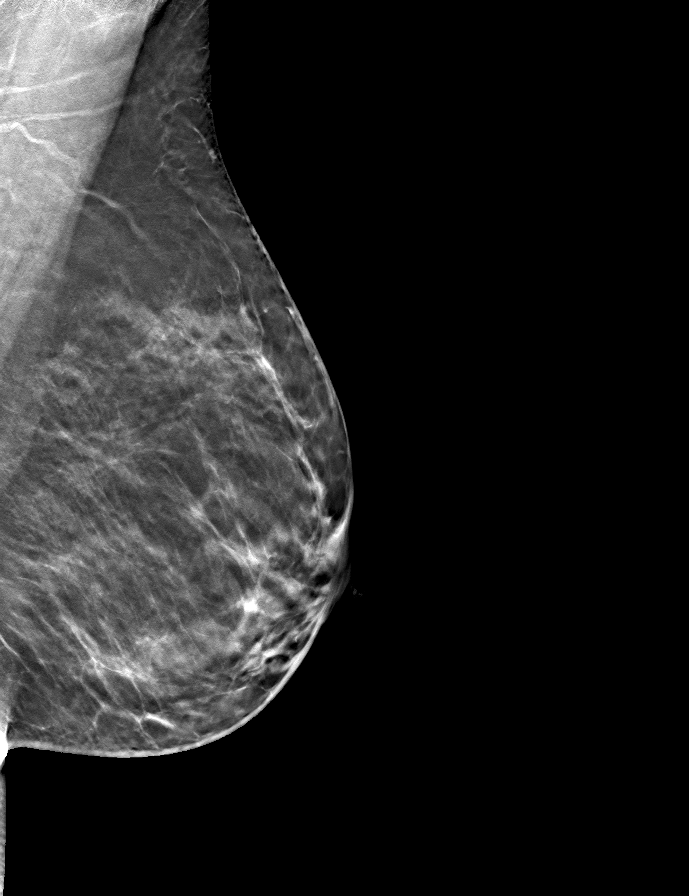
[frame 33/66]
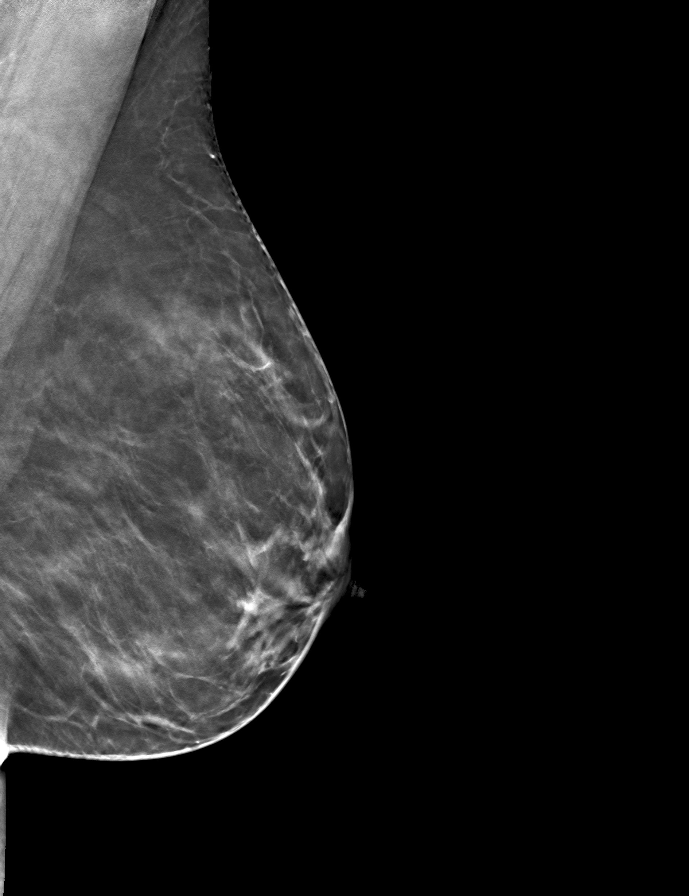

[L CC tomo · tomo slice 33/64.0]
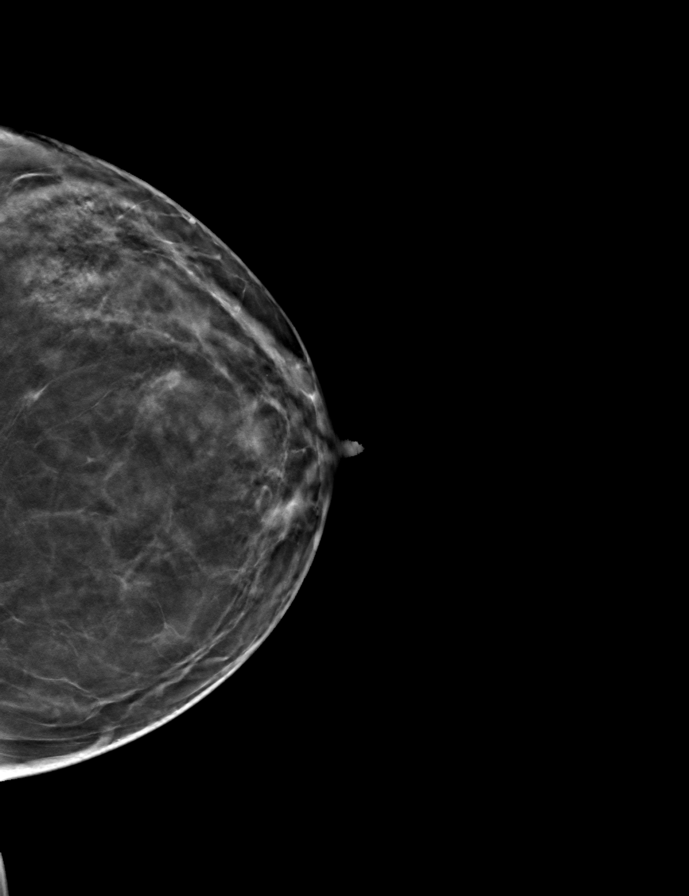

[R CC tomo · tomo slice 31/61.0]
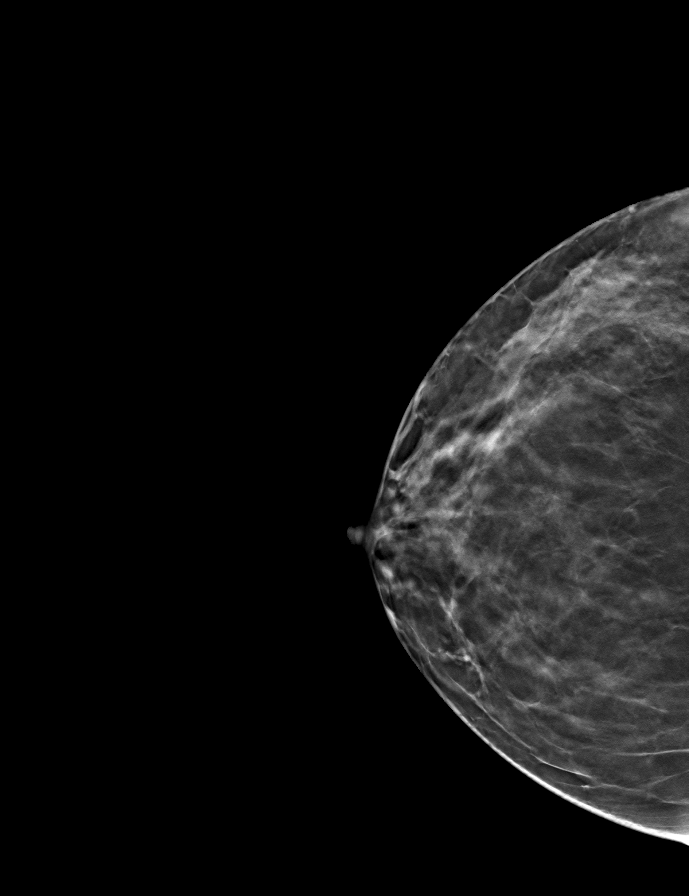

[R MLO tomo · tomo slice 33/66.0]
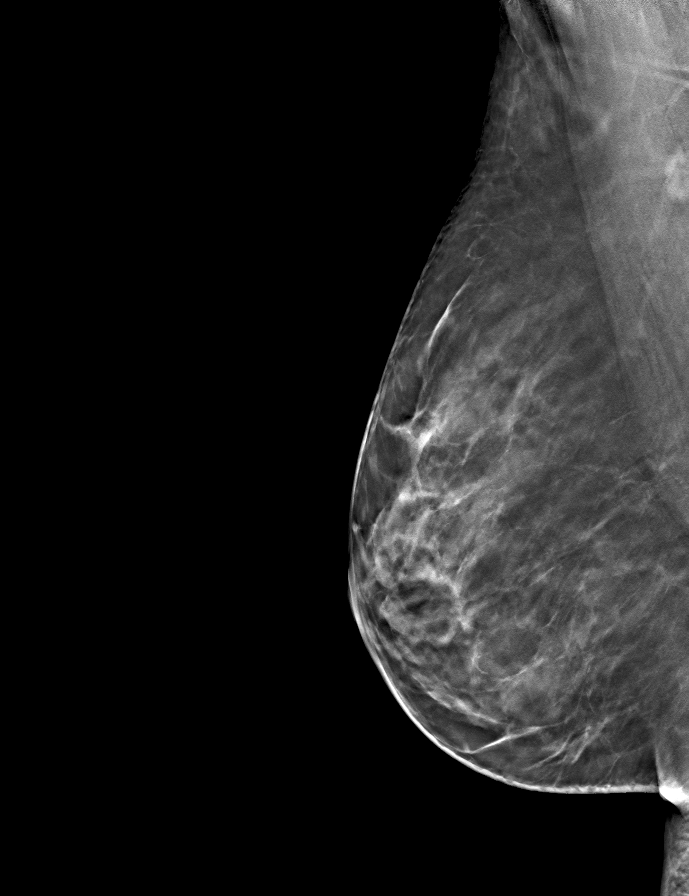

[9 of 24 positions shown; findings below may reference images not displayed]

ACR Breast Density Category c: The breast tissue is heterogeneously
dense, which may obscure small masses.
FINDINGS: There are no findings suspicious for malignancy. Images were
processed with CAD.
IMPRESSION: No mammographic evidence of malignancy. A result letter of this
screening mammogram will be mailed directly to the patient.

RECOMMENDATION:
Screening mammogram in one year. (Code:[id number])

BI-RADS CATEGORY  1: Negative.

## 2021-03-05 ENCOUNTER — Other Ambulatory Visit: Payer: Self-pay | Admitting: Nurse Practitioner

## 2021-03-05 ENCOUNTER — Other Ambulatory Visit: Payer: Self-pay

## 2021-03-05 ENCOUNTER — Other Ambulatory Visit: Payer: Self-pay | Admitting: Family Medicine

## 2021-03-05 DIAGNOSIS — K219 Gastro-esophageal reflux disease without esophagitis: Secondary | ICD-10-CM

## 2021-03-05 MED FILL — Evolocumab Subcutaneous Soln Auto-Injector 140 MG/ML: SUBCUTANEOUS | 84 days supply | Qty: 6 | Fill #0 | Status: AC

## 2021-03-05 NOTE — Telephone Encounter (Signed)
Pt is a former pt of Dr Parks Ranger.  Pharmacy routed to [facility] PEC rerouted to [place].

## 2021-03-06 ENCOUNTER — Other Ambulatory Visit: Payer: Self-pay

## 2021-03-06 ENCOUNTER — Other Ambulatory Visit: Payer: Self-pay | Admitting: Nurse Practitioner

## 2021-03-06 ENCOUNTER — Other Ambulatory Visit: Payer: Self-pay | Admitting: Family Medicine

## 2021-03-06 DIAGNOSIS — K219 Gastro-esophageal reflux disease without esophagitis: Secondary | ICD-10-CM

## 2021-03-06 NOTE — Telephone Encounter (Signed)
   Notes to clinic:  this script has expired  Review for continued use and refill   Requested Prescriptions  Pending Prescriptions Disp Refills   esomeprazole (NEXIUM) 20 MG capsule 180 capsule 1    Sig: TAKE 1 CAPSULE BY MOUTH 2 TIMES DAILY BEFORE A MEAL.      Gastroenterology: Proton Pump Inhibitors Passed - 03/06/2021  7:46 AM      Passed - Valid encounter within last 12 months    Recent Outpatient Visits           10 months ago Annual physical exam   [place], DO   1 year ago Acute right-sided low back pain without sciatica   [place], DO   1 year ago Annual physical exam   [place], DO   2 years ago Elevated hemoglobin A1c   [facility] Olin Hauser, DO   2 years ago Annual physical exam   [facility] [place], Devonne Doughty, DO

## 2021-04-30 ENCOUNTER — Ambulatory Visit: Payer: BC Managed Care – PPO | Admitting: Internal Medicine

## 2021-04-30 ENCOUNTER — Other Ambulatory Visit: Payer: Self-pay | Admitting: Internal Medicine

## 2021-04-30 ENCOUNTER — Other Ambulatory Visit: Payer: Self-pay

## 2021-04-30 ENCOUNTER — Encounter: Payer: Self-pay | Admitting: Internal Medicine

## 2021-04-30 VITALS — BP 100/82 | HR 80 | Temp 98.4°F | Resp 16 | Ht 60.0 in | Wt 99.0 lb

## 2021-04-30 DIAGNOSIS — Z7689 Persons encountering health services in other specified circumstances: Secondary | ICD-10-CM

## 2021-04-30 DIAGNOSIS — J4520 Mild intermittent asthma, uncomplicated: Secondary | ICD-10-CM | POA: Diagnosis not present

## 2021-04-30 DIAGNOSIS — N951 Menopausal and female climacteric states: Secondary | ICD-10-CM

## 2021-04-30 DIAGNOSIS — K219 Gastro-esophageal reflux disease without esophagitis: Secondary | ICD-10-CM

## 2021-04-30 DIAGNOSIS — E782 Mixed hyperlipidemia: Secondary | ICD-10-CM

## 2021-04-30 DIAGNOSIS — Z1239 Encounter for other screening for malignant neoplasm of breast: Secondary | ICD-10-CM

## 2021-04-30 DIAGNOSIS — Z1231 Encounter for screening mammogram for malignant neoplasm of breast: Secondary | ICD-10-CM

## 2021-04-30 MED ORDER — ESOMEPRAZOLE MAGNESIUM 20 MG PO CPDR
DELAYED_RELEASE_CAPSULE | ORAL | 1 refills | Status: DC
  Filled 2021-04-30 (×2): qty 180, 90d supply, fill #0

## 2021-04-30 MED ORDER — ALBUTEROL SULFATE HFA 108 (90 BASE) MCG/ACT IN AERS
2.0000 | INHALATION_SPRAY | Freq: Four times a day (QID) | RESPIRATORY_TRACT | 2 refills | Status: DC | PRN
  Filled 2021-04-30: qty 8.5, 30d supply, fill #0

## 2021-04-30 MED ORDER — EVOLOCUMAB 140 MG/ML ~~LOC~~ SOAJ
SUBCUTANEOUS | 11 refills | Status: DC
  Filled 2021-04-30: qty 2, 28d supply, fill #0

## 2021-04-30 MED ORDER — ESOMEPRAZOLE MAGNESIUM 20 MG PO CPDR: DELAYED_RELEASE_CAPSULE | ORAL | 1 refills | Status: DC

## 2021-04-30 NOTE — Progress Notes (Signed)
[facility] [place], [place] [address]  Internal MEDICINE  Office Visit Note  Patient Name: Meredith Carpenter  [id number]  PM:[id number]  Date of Service: 04/30/2021   Complaints/HPI Pt is here for establishment of PCP.  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 Patient has history of allergy induced asthma and allergic rhinitis and is maintained on her current medications. She also has hyperlipidemia and seen by cardiology and has been given  IM evolocumab every 2 weeks. Patient does need to update her preventive health maintenance will be scheduled. She is requesting to have her lab work done through Meredith Carpenter and requesting all her refills She complains of having vaginal dryness and her cycle has been irregular has not been tested for menopause Current Medication: Outpatient Encounter Medications as of 04/30/2021  Medication Sig   acyclovir (ZOVIRAX) 400 MG tablet Take 1 tablet (400 mg total) by mouth 5 (five) times daily. As needed   albuterol (VENTOLIN HFA) 108 (90 Base) MCG/ACT inhaler Inhale 2 puffs into the lungs every 6 (six) hours as needed for wheezing or shortness of breath (cough).   Cholecalciferol (VITAMIN D-3) 1000 units CAPS Take 1 capsule (1,000 Units total) by mouth daily.   cyclobenzaprine (FLEXERIL) 10 MG tablet Take 0.5-1 tablets (5-10 mg total) by mouth daily as needed for muscle spasms.   Docusate Calcium (STOOL SOFTENER PO) Take by mouth as needed.   esomeprazole (NEXIUM) 20 MG capsule TAKE 1 CAPSULE BY MOUTH 2 TIMES DAILY BEFORE A MEAL.   Evolocumab 140 MG/ML SOAJ INJECT 1 DOSE INTO THE SKIN EVERY 14 (FOURTEEN) DAYS.   ferrous sulfate 325 (65 FE) MG  tablet Take 1 tablet (325 mg total) by mouth daily with breakfast.   fluticasone (FLONASE) 50 MCG/ACT nasal spray Place 2 sprays into both nostrils daily.   montelukast (SINGULAIR) 10 MG tablet TAKE 1 TABLET BY MOUTH AT BEDTIME.   Multiple Vitamin (MULTIVITAMIN) capsule Take 1 capsule by mouth daily.   [DISCONTINUED] albuterol (VENTOLIN HFA) 108 (90 Base) MCG/ACT inhaler Inhale 2 puffs into the lungs every 6 (six) hours as needed for wheezing or shortness of breath (cough).   [DISCONTINUED] cephALEXin (KEFLEX) 500 MG capsule TAKE 1 CAPSULE BY MOUTH EVERY 6 HOURS BEGIN 2 DAYS PRIOR PROCEDURE, TAKE DAY OF PROCEDURE AND 2 DAYS AFTER PROCEDURE (Patient not taking: Reported on 04/30/2021)   [DISCONTINUED] esomeprazole (NEXIUM) 20 MG capsule TAKE 1 CAPSULE BY MOUTH 2 TIMES DAILY BEFORE A MEAL.   [DISCONTINUED] Evolocumab 140 MG/ML SOAJ INJECT 1 DOSE INTO THE SKIN EVERY 14 (FOURTEEN) DAYS.   [DISCONTINUED] ezetimibe (ZETIA) 10 MG tablet TAKE 1 TABLET (10 MG TOTAL) BY MOUTH DAILY.   [DISCONTINUED] medroxyPROGESTERone (PROVERA) 10 MG tablet TAKE 1 TABLET BY MOUTH DAILY ON DAYS 14-28 OF CYCLE EACH MONTH. (Patient not taking: Reported on 04/30/2021)   [DISCONTINUED] metoprolol tartrate (LOPRESSOR) 100 MG tablet Take ONE tablet two hours prior to test.   No facility-administered encounter medications on file as of 04/30/2021.    Surgical History: Past Surgical History:  Procedure Laterality Date   ESOPHAGOGASTRODUODENOSCOPY  2014   ESOPHAGOGASTRODUODENOSCOPY (EGD) WITH PROPOFOL N/A 01/25/2016   Procedure: ESOPHAGOGASTRODUODENOSCOPY (EGD) WITH PROPOFOL;  Surgeon: Lucilla Lame, MD;  Location: [facility];  Service: Endoscopy;  Laterality: N/A;   TONSILLECTOMY AND ADENOIDECTOMY  1979    Medical History: Past Medical History:  Diagnosis Date   Asthma    GERD (gastroesophageal reflux disease)    Vitamin D deficiency     Family History: Family History  Problem Relation Age of Onset   Hyperlipidemia  Mother    Heart disease Mother    Hypertension Mother    Diabetes Mother    Heart attack Mother 85   Heart attack Father 60   Hypertension Brother    Diabetes Brother    Heart disease Brother 57   Heart attack Maternal Uncle    Diabetes Sister    Hypertension Sister    Heart disease Sister 17   Colon cancer Neg Hx    Breast cancer Neg Hx     Social History   Socioeconomic History   Marital status: Married    Spouse name: Not on file   Number of children: 2   Years of education: 16   Highest education level: Not on file  Occupational History   Occupation: Ship broker: [employer]  Tobacco Use   Smoking status: Never   Smokeless tobacco: Never  Vaping Use   Vaping Use: Never used  Substance and Sexual Activity   Alcohol use: No    Alcohol/week: 0.0 standard drinks   Drug use: No   Sexual activity: Yes    Partners: Male    Birth control/protection: Condom    Comment: 1 partner   Other Topics Concern   Not on file  Social History Narrative   Meredith Carpenter grew up in [place]. She is married and has 2 children. 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   Social Determinants of Health   Financial Resource Strain: Not on file  Food Insecurity: Not on file  Transportation Needs: Not on file  Physical Activity: Not on file  Stress: Not on file  Social Connections: Not on file  Intimate Partner Violence: Not on file     Review of Systems  Constitutional:  Negative for chills, fatigue and unexpected weight change.  HENT:  Positive for postnasal drip. Negative for congestion, rhinorrhea, sneezing and sore throat.   Eyes:  Negative for redness.  Respiratory:  Negative for cough, chest tightness and shortness of breath.   Cardiovascular:  Negative for chest pain and palpitations.  Gastrointestinal:  Negative for abdominal pain, constipation, diarrhea, nausea and vomiting.  Genitourinary:  Negative for dysuria and  frequency.  Musculoskeletal:  Negative for arthralgias, back pain, joint swelling and neck pain.  Skin:  Negative for rash.  Neurological: Negative.  Negative for tremors and numbness.  Hematological:  Negative for adenopathy. Does not bruise/bleed easily.  Psychiatric/Behavioral:  Negative for behavioral problems (Depression), sleep disturbance and suicidal ideas. The patient is not nervous/anxious.    Vital Signs: BP 100/82   Pulse 80   Temp 98.4 F (36.9 C)   Resp 16   Ht 5' (1.524 m)   Wt 99 lb (44.9 kg)   SpO2 99%   BMI 19.33 kg/m    Physical Exam Constitutional:      General: She is not in acute distress.    Appearance: She is well-developed. She is not diaphoretic.  HENT:     Head: Normocephalic and atraumatic.     Mouth/Throat:     Pharynx: No oropharyngeal exudate or  posterior oropharyngeal erythema.  Eyes:     Extraocular Movements: Extraocular movements intact.     Pupils: Pupils are equal, round, and reactive to light.  Neck:     Thyroid: No thyromegaly.     Vascular: No JVD.     Trachea: No tracheal deviation.  Cardiovascular:     Rate and Rhythm: Normal rate and regular rhythm.     Heart sounds: Normal heart sounds. No murmur heard.   No friction rub. No gallop.  Pulmonary:     Effort: Pulmonary effort is normal. No respiratory distress.     Breath sounds: No wheezing or rales.  Chest:     Chest wall: No tenderness.  Abdominal:     General: Bowel sounds are normal.     Palpations: Abdomen is soft.  Musculoskeletal:        General: Normal range of motion.     Cervical back: Normal range of motion and neck supple.  Lymphadenopathy:     Cervical: No cervical adenopathy.  Skin:    General: Skin is warm and dry.  Neurological:     Mental Status: She is alert and oriented to person, place, and time.     Cranial Nerves: No cranial nerve deficit.  Psychiatric:        Behavior: Behavior normal.        Thought Content: Thought content normal.         Judgment: Judgment normal.      Assessment/Plan: 1. Encounter to establish care All age-appropriate preventive health maintenance is ordered for patient will schedule mammogram and colonoscopy on next visit - CBC with Differential/Platelet; Future - Lipid Panel With LDL/HDL Ratio; Future - TSH; Future - T4, free; Future - Comprehensive metabolic panel - FSH/LH  2. Mild intermittent asthma without complication We will refill her albuterol continue Singulair as before  3. Gastroesophageal reflux disease Controlled with PPI refill meds - CBC with Differential/Platelet; Future  4. Special screening examination for neoplasm of breast Patient is due for mammogram is ordered today - MM DIGITAL SCREENING BILATERAL; Future  5. Mixed hyperlipidemia Strong family history of CAD continue meds as before as before refills are provided - Evolocumab 140 MG/ML SOAJ; INJECT 1 DOSE INTO THE SKIN EVERY 14 (FOURTEEN) DAYS.  Dispense: 2 mL; Refill: 11 - Lipid Panel With LDL/HDL Ratio; Future   6. Menopausal vasomotor syndrome She is complaining of excessive vaginal dryness, irregular cycle we will get [place] and LH level  General Counseling: Makyia verbalizes understanding of the findings of todays visit and agrees with plan of treatment. I have discussed any further diagnostic evaluation that may be needed or ordered today. We also reviewed her medications today. she has been encouraged to call the office with any questions or concerns that should arise related to todays visit.    Counseling:  [place] Controlled Substance Database was reviewed by me.  Orders Placed This Encounter  Procedures   MM DIGITAL SCREENING BILATERAL   CBC with Differential/Platelet   Lipid Panel With LDL/HDL Ratio   TSH   T4, free   Comprehensive metabolic panel   FSH/LH    Meds ordered this encounter  Medications   esomeprazole (NEXIUM) 20 MG capsule    Sig: TAKE 1 CAPSULE BY MOUTH 2 TIMES DAILY BEFORE A MEAL.     Dispense:  180 capsule    Refill:  1   albuterol (VENTOLIN HFA) 108 (90 Base) MCG/ACT inhaler    Sig: Inhale 2 puffs into the lungs every  6 (six) hours as needed for wheezing or shortness of breath (cough).    Dispense:  8 g    Refill:  2    Change to Albuterol HFA, instead of Ventolin   Evolocumab 140 MG/ML SOAJ    Sig: INJECT 1 DOSE INTO THE SKIN EVERY 14 (FOURTEEN) DAYS.    Dispense:  2 mL    Refill:  11    Time spent:35 Minutes

## 2021-05-01 ENCOUNTER — Other Ambulatory Visit: Payer: Self-pay

## 2021-05-02 ENCOUNTER — Other Ambulatory Visit: Payer: Self-pay

## 2021-05-02 DIAGNOSIS — J4520 Mild intermittent asthma, uncomplicated: Secondary | ICD-10-CM

## 2021-05-02 MED ORDER — ALBUTEROL SULFATE HFA 108 (90 BASE) MCG/ACT IN AERS: 2.0000 | INHALATION_SPRAY | Freq: Four times a day (QID) | RESPIRATORY_TRACT | 2 refills | Status: DC | PRN

## 2021-05-02 NOTE — Telephone Encounter (Signed)
Pt called need pres to [facility] send pres

## 2021-05-03 ENCOUNTER — Other Ambulatory Visit: Payer: Self-pay

## 2021-05-17 ENCOUNTER — Other Ambulatory Visit: Payer: Self-pay | Admitting: Anesthesiology

## 2021-05-17 DIAGNOSIS — Z0001 Encounter for general adult medical examination with abnormal findings: Secondary | ICD-10-CM | POA: Diagnosis not present

## 2021-05-18 LAB — COMPREHENSIVE METABOLIC PANEL
ALT: 21 IU/L (ref 0–32)
AST: 20 IU/L (ref 0–40)
Albumin/Globulin Ratio: 2 (ref 1.2–2.2)
Albumin: 4.7 g/dL (ref 3.8–4.8)
Alkaline Phosphatase: 95 IU/L (ref 44–121)
BUN/Creatinine Ratio: 13 (ref 9–23)
BUN: 9 mg/dL (ref 6–24)
Bilirubin Total: 0.5 mg/dL (ref 0.0–1.2)
CO2: 23 mmol/L (ref 20–29)
Calcium: 9.9 mg/dL (ref 8.7–10.2)
Chloride: 102 mmol/L (ref 96–106)
Creatinine, Ser: 0.7 mg/dL (ref 0.57–1.00)
Globulin, Total: 2.3 g/dL (ref 1.5–4.5)
Glucose: 86 mg/dL (ref 65–99)
Potassium: 5 mmol/L (ref 3.5–5.2)
Sodium: 140 mmol/L (ref 134–144)
Total Protein: 7 g/dL (ref 6.0–8.5)
eGFR: 107 mL/min/{1.73_m2} (ref >59)

## 2021-05-18 LAB — LUTEINIZING HORMONE: LH: 31.5 m[IU]/mL

## 2021-05-18 LAB — CBC WITH DIFFERENTIAL/PLATELET
Basophils Absolute: 0.1 10*3/uL (ref 0.0–0.2)
Basos: 1 %
EOS (ABSOLUTE): 0.2 10*3/uL (ref 0.0–0.4)
Eos: 4 %
Hematocrit: 41.3 % (ref 34.0–46.6)
Hemoglobin: 14 g/dL (ref 11.1–15.9)
Immature Grans (Abs): 0 10*3/uL (ref 0.0–0.1)
Immature Granulocytes: 0 %
Lymphocytes Absolute: 1.8 10*3/uL (ref 0.7–3.1)
Lymphs: 32 %
MCH: 29.9 pg (ref 26.6–33.0)
MCHC: 33.9 g/dL (ref 31.5–35.7)
MCV: 88 fL (ref 79–97)
Monocytes Absolute: 0.5 10*3/uL (ref 0.1–0.9)
Monocytes: 8 %
Neutrophils Absolute: 3 10*3/uL (ref 1.4–7.0)
Neutrophils: 55 %
Platelets: 277 10*3/uL (ref 150–450)
RBC: 4.68 x10E6/uL (ref 3.77–5.28)
RDW: 13.7 % (ref 11.7–15.4)
WBC: 5.6 10*3/uL (ref 3.4–10.8)

## 2021-05-18 LAB — LIPID PANEL W/O CHOL/HDL RATIO
Cholesterol, Total: 149 mg/dL (ref 100–199)
HDL: 52 mg/dL (ref >39)
LDL Chol Calc (NIH): 69 mg/dL (ref 0–99)
Triglycerides: 163 mg/dL — ABNORMAL HIGH (ref 0–149)
VLDL Cholesterol Cal: 28 mg/dL (ref 5–40)

## 2021-05-18 LAB — FOLLICLE STIMULATING HORMONE: FSH: 35.1 m[IU]/mL

## 2021-05-18 LAB — TSH: TSH: 2.09 u[IU]/mL (ref 0.450–4.500)

## 2021-05-18 LAB — T4, FREE: Free T4: 1.48 ng/dL (ref 0.82–1.77)

## 2021-05-18 LAB — HGB A1C W/O EAG: Hgb A1c MFr Bld: 5.4 % (ref 4.8–5.6)

## 2021-05-20 ENCOUNTER — Other Ambulatory Visit: Payer: Self-pay

## 2021-05-20 ENCOUNTER — Other Ambulatory Visit: Payer: Self-pay | Admitting: Internal Medicine

## 2021-05-20 DIAGNOSIS — K219 Gastro-esophageal reflux disease without esophagitis: Secondary | ICD-10-CM

## 2021-05-21 ENCOUNTER — Encounter: Payer: Self-pay | Admitting: Internal Medicine

## 2021-05-21 ENCOUNTER — Other Ambulatory Visit: Payer: Self-pay

## 2021-05-21 ENCOUNTER — Ambulatory Visit (INDEPENDENT_AMBULATORY_CARE_PROVIDER_SITE_OTHER): Payer: BC Managed Care – PPO | Admitting: Internal Medicine

## 2021-05-21 VITALS — BP 118/88 | HR 80 | Temp 97.1°F | Resp 16 | Ht 60.0 in | Wt 99.6 lb

## 2021-05-21 DIAGNOSIS — N959 Unspecified menopausal and perimenopausal disorder: Secondary | ICD-10-CM | POA: Diagnosis not present

## 2021-05-21 DIAGNOSIS — Z0001 Encounter for general adult medical examination with abnormal findings: Secondary | ICD-10-CM

## 2021-05-21 DIAGNOSIS — Z113 Encounter for screening for infections with a predominantly sexual mode of transmission: Secondary | ICD-10-CM | POA: Diagnosis not present

## 2021-05-21 DIAGNOSIS — E782 Mixed hyperlipidemia: Secondary | ICD-10-CM

## 2021-05-21 DIAGNOSIS — Z124 Encounter for screening for malignant neoplasm of cervix: Secondary | ICD-10-CM | POA: Diagnosis not present

## 2021-05-21 DIAGNOSIS — R300 Dysuria: Secondary | ICD-10-CM | POA: Diagnosis not present

## 2021-05-21 MED ORDER — PROGESTERONE MICRONIZED 100 MG PO CAPS
ORAL_CAPSULE | ORAL | 3 refills | Status: DC
  Filled 2021-05-21: qty 15, 30d supply, fill #0
  Filled 2021-06-18: qty 45, 90d supply, fill #1
  Filled 2021-09-13: qty 45, 90d supply, fill #2
  Filled 2022-01-08: qty 45, 90d supply, fill #3

## 2021-05-21 MED ORDER — PNEUMOCOCCAL 20-VAL CONJ VACC 0.5 ML IM SUSY
0.5000 mL | PREFILLED_SYRINGE | INTRAMUSCULAR | 0 refills | Status: AC
  Filled 2021-05-21: qty 0.5, 1d supply, fill #0

## 2021-05-21 MED ORDER — ESTRADIOL 0.025 MG/24HR TD PTTW
1.0000 | MEDICATED_PATCH | TRANSDERMAL | 12 refills | Status: DC
  Filled 2021-05-21: qty 8, 28d supply, fill #0
  Filled 2021-06-18: qty 24, 84d supply, fill #1
  Filled 2021-09-13: qty 24, 84d supply, fill #2
  Filled 2022-01-08: qty 24, 84d supply, fill #3

## 2021-05-21 MED FILL — Evolocumab Subcutaneous Soln Auto-Injector 140 MG/ML: SUBCUTANEOUS | 28 days supply | Qty: 2 | Fill #1 | Status: AC

## 2021-05-21 NOTE — Progress Notes (Signed)
[facility] [place], [place] [address]  Internal MEDICINE  Office Visit Note  Patient Name: Meredith Carpenter  [id number]  [id number]  Date of Service: 05/29/2021  Chief Complaint  Patient presents with   Annual Exam   Gastroesophageal Reflux   Asthma     HPI Pt is here for routine health maintenance examination She has past medical history of asthma, allergies and hyperlipidemia well managed and maintained on her current medications. 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 Hyperlipidemia managed by cardiology and has been given  IM evolocumab every 2 weeks. On previous visit patient was complaining of menopausal symptoms including atrophic vaginitis her [place] and LH is elevated which is reviewed with the patient and option of HRT is discussed. She will need a mammogram and a bone density as a baseline   Current Medication: Outpatient Encounter Medications as of 05/21/2021  Medication Sig   acyclovir (ZOVIRAX) 400 MG tablet Take 1 tablet (400 mg total) by mouth 5 (five) times daily. As needed   albuterol (VENTOLIN HFA) 108 (90 Base) MCG/ACT inhaler Inhale 2 puffs into the lungs every 6 (six) hours as needed for wheezing or shortness of breath (cough).   Cholecalciferol (VITAMIN D-3) 1000 units CAPS Take 1 capsule (1,000 Units total) by mouth daily.   cyclobenzaprine (FLEXERIL) 10 MG tablet Take 0.5-1 tablets (5-10 mg total) by mouth daily as needed for muscle spasms.   Docusate Calcium (STOOL SOFTENER PO) Take by mouth as needed.   esomeprazole (NEXIUM) 20 MG capsule TAKE 1 CAPSULE BY MOUTH 2 TIMES DAILY BEFORE A MEAL.   estradiol (VIVELLE-DOT) 0.025 MG/24HR Place 1 patch onto the skin 2 (two) times a week.   Evolocumab 140 MG/ML SOAJ INJECT 1 DOSE INTO THE SKIN EVERY 14 (FOURTEEN) DAYS.   ferrous sulfate 325 (65 FE) MG tablet Take 1 tablet (325 mg total) by mouth daily with breakfast.   fluticasone (FLONASE) 50 MCG/ACT nasal spray Place 2  sprays into both nostrils daily.   montelukast (SINGULAIR) 10 MG tablet TAKE 1 TABLET BY MOUTH AT BEDTIME.   Multiple Vitamin (MULTIVITAMIN) capsule Take 1 capsule by mouth daily.   progesterone (PROMETRIUM) 100 MG capsule Take 1 capsule by mouth every other day   [DISCONTINUED] pneumococcal 20-Val Conj Vacc (PREVNAR 20) 0.5 ML injection Inject 0.5 mLs into the muscle tomorrow at 10 am.   [EXPIRED] pneumococcal 20-Val Conj Vacc (PREVNAR 20) 0.5 ML injection Inject 0.5 mLs into the muscle tomorrow at 10 am for 1 dose.   [DISCONTINUED] metoprolol tartrate (LOPRESSOR) 100 MG tablet Take ONE tablet two hours prior to test.   No facility-administered encounter medications on file as of 05/21/2021.    Surgical History: Past Surgical History:  Procedure Laterality Date   ESOPHAGOGASTRODUODENOSCOPY  2014   ESOPHAGOGASTRODUODENOSCOPY (EGD) WITH PROPOFOL N/A 01/25/2016   Procedure: ESOPHAGOGASTRODUODENOSCOPY (EGD) WITH PROPOFOL;  Surgeon: Lucilla Lame, MD;  Location: [facility];  Service: Endoscopy;  Laterality: N/A;   TONSILLECTOMY AND ADENOIDECTOMY  1979    Medical History: Past Medical History:  Diagnosis Date   Asthma    GERD (gastroesophageal reflux disease)    Vitamin D deficiency     Family History: Family History  Problem Relation Age of Onset   Hyperlipidemia Mother    Heart disease Mother    Hypertension Mother    Diabetes Mother    Heart attack Mother 47   Heart attack Father 20   Hypertension Brother  Diabetes Brother    Heart disease Brother 75   Heart attack Maternal Uncle    Diabetes Sister    Hypertension Sister    Heart disease Sister 52   Colon cancer Neg Hx    Breast cancer Neg Hx     Social History: Social History   Socioeconomic History   Marital status: Married    Spouse name: Not on file   Number of children: 2   Years of education: 16   Highest education level: Not on file  Occupational History   Occupation: Designer, multimedia: [employer]  Tobacco Use   Smoking status: Never   Smokeless tobacco: Never  Vaping Use   Vaping Use: Never used  Substance and Sexual Activity   Alcohol use: No    Alcohol/week: 0.0 standard drinks   Drug use: No   Sexual activity: Yes    Partners: Male    Birth control/protection: Condom    Comment: 1 partner   Other Topics Concern   Not on file  Social History Narrative   [name] grew up in [place]. She is married and has 2 children. 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   Social Determinants of Health   Financial Resource Strain: Not on file  Food Insecurity: Not on file  Transportation Needs: Not on file  Physical Activity: Not on file  Stress: Not on file  Social Connections: Not on file      Review of Systems  Constitutional:  Negative for chills, diaphoresis and fatigue.  HENT:  Negative for ear pain, postnasal drip and sinus pressure.   Eyes:  Negative for photophobia, discharge, redness, itching and visual disturbance.  Respiratory:  Negative for cough, shortness of breath and wheezing.   Cardiovascular:  Negative for chest pain, palpitations and leg swelling.  Gastrointestinal:  Negative for abdominal pain, constipation, diarrhea, nausea and vomiting.  Genitourinary:  Negative for dysuria and flank pain.  Musculoskeletal:  Negative for arthralgias, back pain, gait problem and neck pain.  Skin:  Negative for color change.  Allergic/Immunologic: Negative for environmental allergies and food allergies.  Neurological:  Negative for dizziness and headaches.  Hematological:  Does not bruise/bleed easily.  Psychiatric/Behavioral:  Negative for agitation, behavioral problems (depression) and hallucinations.     Vital Signs: BP 118/88   Pulse 80   Temp (!) 97.1 F (36.2 C)   Resp 16   Ht 5' (1.524 m)   Wt 99 lb 9.6 oz (45.2 kg)   SpO2 99%   BMI 19.45 kg/m    Physical Exam Constitutional:      General:  She is not in acute distress.    Appearance: She is well-developed. She is not diaphoretic.  HENT:     Head: Normocephalic and atraumatic.     Mouth/Throat:     Pharynx: No oropharyngeal exudate.  Eyes:     Pupils: Pupils are equal, round, and reactive to light.  Neck:     Thyroid: No thyromegaly.     Vascular: No JVD.     Trachea: No tracheal deviation.  Cardiovascular:     Rate and Rhythm: Normal rate and regular rhythm.     Heart sounds: Normal heart sounds. No murmur heard.   No friction rub. No gallop.  Pulmonary:     Effort: Pulmonary effort is normal. No respiratory distress.     Breath sounds: No wheezing or rales.  Chest:  Chest wall: No tenderness.  Breasts:    Right: Normal.     Left: Normal.  Abdominal:     General: Bowel sounds are normal.     Palpations: Abdomen is soft.  Musculoskeletal:        General: Normal range of motion.     Cervical back: Normal range of motion and neck supple.  Lymphadenopathy:     Cervical: No cervical adenopathy.  Skin:    General: Skin is warm and dry.  Neurological:     Mental Status: She is alert and oriented to person, place, and time.     Cranial Nerves: No cranial nerve deficit.  Psychiatric:        Behavior: Behavior normal.        Thought Content: Thought content normal.        Judgment: Judgment normal.     LABS: Recent Results (from the past 2160 hour(s))  CBC with Differential/Platelet     Status: None   Collection Time: 05/17/21 10:34 AM  Result Value Ref Range   WBC 5.6 3.4 - 10.8 x10E3/uL   RBC 4.68 3.77 - 5.28 x10E6/uL   Hemoglobin 14.0 11.1 - 15.9 g/dL   Hematocrit 41.3 34.0 - 46.6 %   MCV 88 79 - 97 fL   MCH 29.9 26.6 - 33.0 pg   MCHC 33.9 31.5 - 35.7 g/dL   RDW 13.7 11.7 - 15.4 %   Platelets 277 150 - 450 x10E3/uL   Neutrophils 55 Not Estab. %   Lymphs 32 Not Estab. %   Monocytes 8 Not Estab. %   Eos 4 Not Estab. %   Basos 1 Not Estab. %   Neutrophils Absolute 3.0 1.4 - 7.0 x10E3/uL    Lymphocytes Absolute 1.8 0.7 - 3.1 x10E3/uL   Monocytes Absolute 0.5 0.1 - 0.9 x10E3/uL   EOS (ABSOLUTE) 0.2 0.0 - 0.4 x10E3/uL   Basophils Absolute 0.1 0.0 - 0.2 x10E3/uL   Immature Granulocytes 0 Not Estab. %   Immature Grans (Abs) 0.0 0.0 - 0.1 x10E3/uL  Comprehensive metabolic panel     Status: None   Collection Time: 05/17/21 10:34 AM  Result Value Ref Range   Glucose 86 65 - 99 mg/dL   BUN 9 6 - 24 mg/dL   Creatinine, Ser 0.70 0.57 - 1.00 mg/dL   eGFR 107 >59 mL/min/1.73   BUN/Creatinine Ratio 13 9 - 23   Sodium 140 134 - 144 mmol/L   Potassium 5.0 3.5 - 5.2 mmol/L   Chloride 102 96 - 106 mmol/L   CO2 23 20 - 29 mmol/L   Calcium 9.9 8.7 - 10.2 mg/dL   Total Protein 7.0 6.0 - 8.5 g/dL   Albumin 4.7 3.8 - 4.8 g/dL   Globulin, Total 2.3 1.5 - 4.5 g/dL   Albumin/Globulin Ratio 2.0 1.2 - 2.2   Bilirubin Total 0.5 0.0 - 1.2 mg/dL   Alkaline Phosphatase 95 44 - 121 IU/L   AST 20 0 - 40 IU/L   ALT 21 0 - 32 IU/L  Lipid Panel w/o Chol/HDL Ratio     Status: Abnormal   Collection Time: 05/17/21 10:34 AM  Result Value Ref Range   Cholesterol, Total 149 100 - 199 mg/dL   Triglycerides 163 (H) 0 - 149 mg/dL   HDL 52 >39 mg/dL   VLDL Cholesterol Cal 28 5 - 40 mg/dL   LDL Chol Calc (NIH) 69 0 - 99 mg/dL  Hgb A1c w/o eAG     Status: None  Collection Time: 05/17/21 10:34 AM  Result Value Ref Range   Hgb A1c MFr Bld 5.4 4.8 - 5.6 %    Comment:          Prediabetes: 5.7 - 6.4          Diabetes: >6.4          Glycemic control for adults with diabetes: <7.0   T4, free     Status: None   Collection Time: 05/17/21 10:34 AM  Result Value Ref Range   Free T4 1.48 0.82 - 1.77 ng/dL  TSH     Status: None   Collection Time: 05/17/21 10:34 AM  Result Value Ref Range   TSH 2.090 0.450 - 4.500 uIU/mL  Luteinizing hormone     Status: None   Collection Time: 05/17/21 10:34 AM  Result Value Ref Range   LH 31.5 mIU/mL    Comment:                     Adult Female:                        Follicular phase      2.4 -  12.6                       Ovulation phase      14.0 -  95.6                       Luteal phase          1.0 -  11.4                       Postmenopausal        7.7 -  48.8   Follicle stimulating hormone     Status: None   Collection Time: 05/17/21 10:34 AM  Result Value Ref Range   FSH 35.1 mIU/mL    Comment:                     Adult Female:                       Follicular phase      3.5 -  12.5                       Ovulation phase       4.7 -  21.5                       Luteal phase          1.7 -   7.7                       Postmenopausal       25.8 - 134.8   UA/M w/rflx Culture, Routine     Status: None   Collection Time: 05/21/21  4:02 PM   Specimen: Urine   Urine  Result Value Ref Range   Specific Gravity, UA 1.008 1.005 - 1.030   pH, UA 6.0 5.0 - 7.5   Color, UA Yellow Yellow   Appearance Ur Clear Clear   Leukocytes,UA Negative Negative   Protein,UA Negative Negative/Trace   Glucose, UA Negative Negative   Ketones, UA Negative Negative   RBC, UA Negative Negative   Bilirubin, UA Negative Negative  Urobilinogen, Ur 0.2 0.2 - 1.0 mg/dL   Nitrite, UA Negative Negative   Microscopic Examination Comment     Comment: Microscopic follows if indicated.   Microscopic Examination See below:     Comment: Microscopic was indicated and was performed.   Urinalysis Reflex Comment     Comment: This specimen will not reflex to a Urine Culture.  IGP, Aptima HPV     Status: None   Collection Time: 05/21/21  4:02 PM  Result Value Ref Range   Interpretation NILM     Comment: NEGATIVE FOR INTRAEPITHELIAL LESION OR MALIGNANCY.   Category NIL     Comment: Negative for Intraepithelial Lesion   Adequacy SECNI     Comment: Satisfactory for evaluation. No endocervical component is identified.   Clinician Provided ICD10 Comment     Comment: Z12.4   Performed by: Comment     Comment: Lovey Newcomer, Supervisory Cytotechnologist (ASCP)   Note: Comment      Comment: The Pap smear is a screening test designed to aid in the detection of premalignant and malignant conditions of the uterine cervix.  It is not a diagnostic procedure and should not be used as the sole means of detecting cervical cancer.  Both false-positive and false-negative reports do occur.    Test Methodology Comment     Comment: This liquid based ThinPrep(R) pap test was screened with the use of an image guided system.    HPV Aptima Negative Negative    Comment: This nucleic acid amplification test detects fourteen high-risk HPV types (16,18,31,33,35,39,45,51,52,56,58,59,66,68) without differentiation.   NuSwab Vaginitis Plus (VG+)     Status: None   Collection Time: 05/21/21  4:02 PM  Result Value Ref Range   Atopobium vaginae Low - 0 Score   BVAB 2 Low - 0 Score   Megasphaera 1 Low - 0 Score    Comment: Calculate total score by adding the 3 individual bacterial vaginosis (BV) marker scores together.  Total score is interpreted as follows: Total score 0-1: Indicates the absence of BV. Total score   2: Indeterminate for BV. Additional clinical                  data should be evaluated to establish a                  diagnosis. Total score 3-6: Indicates the presence of BV. This test was developed and its performance characteristics determined by [facility].  It has not been cleared or approved by the Food and Drug Administration.    Candida albicans, NAA Negative Negative   Candida glabrata, NAA Negative Negative   Trich vag by NAA Negative Negative   Chlamydia trachomatis, NAA Negative Negative   Neisseria gonorrhoeae, NAA Negative Negative  Microscopic Examination     Status: None   Collection Time: 05/21/21  4:02 PM   Urine  Result Value Ref Range   WBC, UA None seen 0 - 5 /hpf   RBC None seen 0 - 2 /hpf   Epithelial Cells (non renal) 0-10 0 - 10 /hpf   Casts None seen None seen /lpf   Bacteria, UA None seen None seen/Few       Assessment/Plan: 1.  Encounter for general adult medical examination with abnormal findings We will update preventive health maintenance, she will need Cologuard, mammogram and BMD  2. Screening for STD (sexually transmitted disease) - NuSwab Vaginitis Plus (VG+)  3. Routine cervical smear Did have atrophic vaginal mucosa - IGP,  Aptima HPV  4. Menopausal and perimenopausal disorder Patient will be started on low-dose HRT and preventive low-dose  progesterone patient will also take calcium and vitamin D on a regular basis to prevent any kind of blood loss - estradiol (VIVELLE-DOT) 0.025 MG/24HR; Place 1 patch onto the skin 2 (two) times a week.  Dispense: 24 patch; Refill: 12 - progesterone (PROMETRIUM) 100 MG capsule; Take 1 capsule by mouth every other day  Dispense: 45 capsule; Refill: 3  5. Mixed hyperlipidemia Patient is to continue on her antilipid therapy by cardiology  6. Dysuria - UA/M w/rflx Culture, Routine - Microscopic Examination   General Counseling: Talitha verbalizes understanding of the findings of todays visit and agrees with plan of treatment. I have discussed any further diagnostic evaluation that may be needed or ordered today. We also reviewed her medications today. she has been encouraged to call the office with any questions or concerns that should arise related to todays visit.    Counseling:  [place] Controlled Substance Database was reviewed by me.  Orders Placed This Encounter  Procedures   Microscopic Examination   UA/M w/rflx Culture, Routine   NuSwab Vaginitis Plus (VG+)    Meds ordered this encounter  Medications   pneumococcal 20-Val Conj Vacc (PREVNAR 20) 0.5 ML injection    Sig: Inject 0.5 mLs into the muscle tomorrow at 10 am for 1 dose.    Dispense:  0.5 mL    Refill:  0   estradiol (VIVELLE-DOT) 0.025 MG/24HR    Sig: Place 1 patch onto the skin 2 (two) times a week.    Dispense:  24 patch    Refill:  12   progesterone (PROMETRIUM) 100 MG capsule    Sig:  Take 1 capsule by mouth every other day    Dispense:  45 capsule    Refill:  3    Total time spent:35 Minutes  Time spent includes review of chart, medications, test results, and follow up plan with the patient.     Lavera Guise, MD  Internal Medicine

## 2021-05-22 ENCOUNTER — Other Ambulatory Visit: Payer: Self-pay

## 2021-05-22 LAB — MICROSCOPIC EXAMINATION
Bacteria, UA: NONE SEEN
Casts: NONE SEEN /lpf
RBC, Urine: NONE SEEN /hpf (ref 0–2)
WBC, UA: NONE SEEN /hpf (ref 0–5)

## 2021-05-22 LAB — UA/M W/RFLX CULTURE, ROUTINE
Bilirubin, UA: NEGATIVE
Glucose, UA: NEGATIVE
Ketones, UA: NEGATIVE
Leukocytes,UA: NEGATIVE
Nitrite, UA: NEGATIVE
Protein,UA: NEGATIVE
RBC, UA: NEGATIVE
Specific Gravity, UA: 1.008 (ref 1.005–1.030)
Urobilinogen, Ur: 0.2 mg/dL (ref 0.2–1.0)
pH, UA: 6 (ref 5.0–7.5)

## 2021-05-23 ENCOUNTER — Other Ambulatory Visit: Payer: Self-pay

## 2021-05-23 ENCOUNTER — Ambulatory Visit: Payer: BC Managed Care – PPO

## 2021-05-23 LAB — NUSWAB VAGINITIS PLUS (VG+)
Candida albicans, NAA: NEGATIVE
Candida glabrata, NAA: NEGATIVE
Chlamydia trachomatis, NAA: NEGATIVE
Neisseria gonorrhoeae, NAA: NEGATIVE
Trich vag by NAA: NEGATIVE

## 2021-05-24 ENCOUNTER — Other Ambulatory Visit: Payer: Self-pay

## 2021-05-24 LAB — IGP, APTIMA HPV: HPV Aptima: NEGATIVE

## 2021-06-18 ENCOUNTER — Other Ambulatory Visit: Payer: Self-pay

## 2021-06-18 MED FILL — Evolocumab Subcutaneous Soln Auto-Injector 140 MG/ML: SUBCUTANEOUS | 28 days supply | Qty: 2 | Fill #2 | Status: AC

## 2021-06-19 ENCOUNTER — Other Ambulatory Visit: Payer: Self-pay

## 2021-06-26 ENCOUNTER — Other Ambulatory Visit: Payer: Self-pay

## 2021-06-26 MED ORDER — EVOLOCUMAB 140 MG/ML ~~LOC~~ SOAJ
SUBCUTANEOUS | 11 refills | Status: DC
  Filled 2021-06-26: qty 2, 28d supply, fill #0

## 2021-07-10 ENCOUNTER — Other Ambulatory Visit: Payer: Self-pay

## 2021-07-10 ENCOUNTER — Ambulatory Visit: Payer: BC Managed Care – PPO

## 2021-07-10 DIAGNOSIS — Z23 Encounter for immunization: Secondary | ICD-10-CM

## 2021-08-01 ENCOUNTER — Other Ambulatory Visit: Payer: Self-pay

## 2021-08-01 ENCOUNTER — Telehealth: Payer: Self-pay

## 2021-08-01 DIAGNOSIS — R21 Rash and other nonspecific skin eruption: Secondary | ICD-10-CM

## 2021-08-01 MED ORDER — TRIAMCINOLONE ACETONIDE 0.1 % EX CREA: 1.0000 "application " | TOPICAL_CREAM | Freq: Two times a day (BID) | CUTANEOUS | 1 refills | Status: DC

## 2021-08-01 NOTE — Telephone Encounter (Signed)
Pt called that she had rash on face and its not going away and as per dr Humphrey Rolls send triamcinolone and send referral for dermatology

## 2021-08-05 ENCOUNTER — Ambulatory Visit (INDEPENDENT_AMBULATORY_CARE_PROVIDER_SITE_OTHER): Payer: BC Managed Care – PPO | Admitting: Dermatology

## 2021-08-05 ENCOUNTER — Other Ambulatory Visit: Payer: Self-pay

## 2021-08-05 DIAGNOSIS — L719 Rosacea, unspecified: Secondary | ICD-10-CM | POA: Diagnosis not present

## 2021-08-05 NOTE — Progress Notes (Signed)
   New Patient Visit  Subjective  Meredith Carpenter is a 50 y.o. female who presents for the following: Rash (Patient here today for rash with bumps at face, comes and goes and is worse with sun exposure and stress. Patient has also had similar rash at arms when exposed as well. Breakouts occur only a few times a year. ).  Patient does have photos of rash when break outs flared. No irritation or grittiness of the eyes.   The following portions of the chart were reviewed this encounter and updated as appropriate:   Tobacco  Allergies  Meds  Problems  Med Hx  Surg Hx  Fam Hx     Review of Systems:  No other skin or systemic complaints except as noted in HPI or Assessment and Plan.  Objective  Well appearing patient in no apparent distress; mood and affect are within normal limits.  A focused examination was performed including face. Relevant physical exam findings are noted in the Assessment and Plan.  face Photos show papules and pustules scattered at face. Clear today.   Assessment & Plan  Rosacea -with frequent flares typically associated with stress and sun exposure. face Rosacea is a chronic progressive skin condition usually affecting the face of adults, causing redness and/or acne bumps. It is treatable but not curable. It sometimes affects the eyes (ocular rosacea) as well. It may respond to topical and/or systemic medication and can flare with stress, sun exposure, alcohol, exercise and some foods.  Daily application of broad spectrum spf 30+ sunscreen to face is recommended to reduce flares.  Exacerbated by sun exposure. Patient should avoid sun exposure and use sunscreen and sun protection.  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  Will prescribe [facility] metronidazole/ivermectin/azelaic acid twice daily as needed to affected areas on the face. The  patient was advised this is not covered by insurance since it is made by a compounding pharmacy. They will receive an email to check out and the medication will be mailed to their home.   Advised to discontinue the topical steroid she has been using.  Return in about 3 months (around 11/05/2021) for Rosacea.  Graciella Belton, RMA, am acting as scribe for Sarina Ser, MD . Documentation: I have reviewed the above documentation for accuracy and completeness, and I agree with the above.  Sarina Ser, MD

## 2021-08-05 NOTE — Patient Instructions (Addendum)
Rosacea  What is rosacea? Rosacea (say: ro-zay-sha) is a common skin disease that usually begins as a trend of flushing or blushing easily.  As rosacea progresses, a persistent redness in the center of the face will develop and may gradually spread beyond the nose and cheeks to the forehead and chin.  In some cases, the ears, chest, and back could be affected.  Rosacea may appear as tiny blood vessels or small red bumps that occur in crops.  Frequently they can contain pus, and are called "pustules".  If the bumps do not contain pus, they are referred to as "papules".  Rarely, in prolonged, untreated cases of rosacea, the oil glands of the nose and cheeks may become permanently enlarged.  This is called rhinophyma, and is seen more frequently in men.  Signs and Risks In its beginning stages, rosacea tends to come and go, which makes it difficult to recognize.  It can start as intermittent flushing of the face.  Eventually, blood vessels may become permanently visible.  Pustules and papules can appear, but can be mistaken for adult acne.  People of all races, ages, genders and ethnic groups are at risk of developing rosacea.  However, it is more common in women (especially around menopause) and adults with fair skin between the ages of 3 and 87.  Treatment Dermatologists typically recommend a combination of treatments to effectively manage rosacea.  Treatment can improve symptoms and may stop the progression of the rosacea.  Treatment may involve both topical and oral medications.  The tetracycline antibiotics are often used for their anti-inflammatory effect; however, because of the possibility of developing antibiotic resistance, they should not be used long term at full dose.  For dilated blood vessels the options include electrodessication (uses electric current through a small needle), laser treatment, and cosmetics to hide the redness.   With all forms of treatment, improvement is a slow process, and  patients may not see any results for the first 3-4 weeks.  It is very important to avoid the sun and other triggers.  Patients must wear sunscreen daily.  Skin Care Instructions: Cleanse the skin with a mild soap such as CeraVe cleanser, Cetaphil cleanser, or Dove soap once or twice daily as needed. Moisturize with Eucerin Redness Relief Daily Perfecting Lotion (has a subtle green tint), CeraVe Moisturizing Cream, or Oil of Olay Daily Moisturizer with sunscreen every morning and/or night as recommended. Makeup should be "non-comedogenic" (won't clog pores) and be labeled "for sensitive skin". Good choices for cosmetics are: Neutrogena, Almay, and Physician's Formula.  Any product with a green tint tends to offset a red complexion. If your eyes are dry and irritated, use artificial tears 2-3 times per day and cleanse the eyelids daily with baby shampoo.  Have your eyes examined at least every 2 years.  Be sure to tell your eye doctor that you have rosacea. Alcoholic beverages tend to cause flushing of the skin, and may make rosacea worse. Always wear sunscreen, protect your skin from extreme hot and cold temperatures, and avoid spicy foods, hot drinks, and mechanical irritation such as rubbing, scrubbing, or massaging the face.  Avoid harsh skin cleansers, cleansing masks, astringents, and exfoliation. If a particular product burns or makes your face feel tight, then it is likely to flare your rosacea. If you are having difficulty finding a sunscreen that you can tolerate, you may try switching to a chemical-free sunscreen.  These are ones whose active ingredient is zinc oxide or titanium dioxide  only.  They should also be fragrance free, non-comedogenic, and labeled for sensitive skin. Rosacea triggers may vary from person to person.  There are a variety of foods that have been reported to trigger rosacea.  Some patients find that keeping a diary of what they were doing when they flared helps them avoid  triggers.  Rosacea is a chronic progressive skin condition usually affecting the face of adults, causing redness and/or acne bumps. It is treatable but not curable. It sometimes affects the eyes (ocular rosacea) as well. It may respond to topical and/or systemic medication and can flare with stress, sun exposure, alcohol, exercise and some foods.  Daily application of broad spectrum spf 30+ sunscreen to face is recommended to reduce flares.   Will prescribe [facility] metronidazole/ivermectin/azelaic acid once to twice daily as needed to affected areas on the face. The patient was advised this is not covered by insurance since it is made by a compounding pharmacy. They will receive an email to check out and the medication will be mailed to their home.   Instructions for [facility] Medications  One or more of your medications was sent to the [facility] mail order compounding pharmacy. You will receive an email from them and can purchase the medicine through that link. It will then be mailed to your home at the address you confirmed. If for any reason you do not receive an email from them, please check your spam folder. If you still do not find the email, please let us know. [facility] phone number is [phone].

## 2021-08-06 ENCOUNTER — Telehealth: Payer: Self-pay

## 2021-08-06 ENCOUNTER — Encounter: Payer: Self-pay | Admitting: Dermatology

## 2021-08-06 ENCOUNTER — Other Ambulatory Visit: Payer: Self-pay

## 2021-08-06 ENCOUNTER — Other Ambulatory Visit: Payer: Self-pay | Admitting: Internal Medicine

## 2021-08-06 MED ORDER — DOXYCYCLINE HYCLATE 100 MG PO TABS
100.0000 mg | ORAL_TABLET | Freq: Every day | ORAL | 0 refills | Status: DC
  Filled 2021-08-06: qty 30, 30d supply, fill #0

## 2021-08-06 MED FILL — Evolocumab Subcutaneous Soln Auto-Injector 140 MG/ML: SUBCUTANEOUS | 28 days supply | Qty: 2 | Fill #0 | Status: AC

## 2021-08-06 NOTE — Telephone Encounter (Signed)
Patient called regarding a new rosacea flare this morning. She states went the area was evaluated yesterday the skin was calm but today she is having a outbreak, along with blisters. She wants to know what can she use now while waiting for the [facility] Cream?

## 2021-08-06 NOTE — Telephone Encounter (Signed)
RX sent in and patient advised of information per Dr. Nehemiah Massed.

## 2021-08-07 ENCOUNTER — Other Ambulatory Visit: Payer: Self-pay

## 2021-08-08 ENCOUNTER — Other Ambulatory Visit: Payer: Self-pay

## 2021-08-08 ENCOUNTER — Encounter: Payer: Self-pay | Admitting: Dermatology

## 2021-08-08 MED ORDER — PIMECROLIMUS 1 % EX CREA
TOPICAL_CREAM | CUTANEOUS | 0 refills | Status: DC
  Filled 2021-08-08: qty 30, 30d supply, fill #0

## 2021-08-09 ENCOUNTER — Other Ambulatory Visit: Payer: Self-pay

## 2021-08-12 ENCOUNTER — Telehealth: Payer: Self-pay

## 2021-08-12 NOTE — Telephone Encounter (Signed)
PA for pimecrolimus denied by insurance. Pt must try and fail tacrolimus.

## 2021-08-13 ENCOUNTER — Other Ambulatory Visit: Payer: Self-pay

## 2021-08-13 MED ORDER — TACROLIMUS 0.1 % EX OINT
TOPICAL_OINTMENT | CUTANEOUS | 3 refills | Status: DC
  Filled 2021-08-13 (×2): qty 60, 30d supply, fill #0

## 2021-08-13 NOTE — Addendum Note (Signed)
Addended by: Harriett Sine on: 08/13/2021 03:02 PM   Modules accepted: Orders

## 2021-08-16 ENCOUNTER — Other Ambulatory Visit: Payer: Self-pay

## 2021-08-16 MED FILL — Evolocumab Subcutaneous Soln Auto-Injector 140 MG/ML: SUBCUTANEOUS | 28 days supply | Qty: 2 | Fill #1 | Status: CN

## 2021-09-02 ENCOUNTER — Other Ambulatory Visit: Payer: Self-pay

## 2021-09-02 MED FILL — Evolocumab Subcutaneous Soln Auto-Injector 140 MG/ML: SUBCUTANEOUS | 84 days supply | Qty: 6 | Fill #1 | Status: CN

## 2021-09-13 ENCOUNTER — Other Ambulatory Visit: Payer: Self-pay

## 2021-09-13 MED FILL — Evolocumab Subcutaneous Soln Auto-Injector 140 MG/ML: SUBCUTANEOUS | 84 days supply | Qty: 6 | Fill #1 | Status: AC

## 2021-09-16 ENCOUNTER — Other Ambulatory Visit: Payer: Self-pay

## 2021-09-17 ENCOUNTER — Other Ambulatory Visit: Payer: Self-pay

## 2021-10-14 ENCOUNTER — Other Ambulatory Visit: Payer: Self-pay | Admitting: Internal Medicine

## 2021-10-14 ENCOUNTER — Other Ambulatory Visit: Payer: Self-pay | Admitting: *Deleted

## 2021-10-14 DIAGNOSIS — E7849 Other hyperlipidemia: Secondary | ICD-10-CM

## 2021-10-25 ENCOUNTER — Other Ambulatory Visit: Payer: Self-pay

## 2021-10-31 ENCOUNTER — Encounter: Payer: Self-pay | Admitting: Dermatology

## 2021-11-04 ENCOUNTER — Other Ambulatory Visit: Payer: Self-pay

## 2021-11-04 MED ORDER — DOXYCYCLINE HYCLATE 100 MG PO TABS
100.0000 mg | ORAL_TABLET | Freq: Every day | ORAL | 0 refills | Status: DC
  Filled 2021-11-04: qty 30, 30d supply, fill #0

## 2021-11-04 MED ORDER — TACROLIMUS 0.1 % EX OINT
TOPICAL_OINTMENT | CUTANEOUS | 0 refills | Status: DC
  Filled 2021-11-04 (×2): qty 60, 30d supply, fill #0
  Filled 2021-11-05 (×4): qty 60, 20d supply, fill #0

## 2021-11-04 NOTE — Progress Notes (Signed)
RFs OKed per Dr. Nehemiah Massed. aw

## 2021-11-05 ENCOUNTER — Other Ambulatory Visit: Payer: Self-pay

## 2021-11-07 ENCOUNTER — Other Ambulatory Visit: Payer: Self-pay

## 2021-11-08 ENCOUNTER — Telehealth: Payer: Self-pay | Admitting: Internal Medicine

## 2021-11-08 NOTE — Telephone Encounter (Signed)
PA for repatha submitted via CMM (Key: [id number])

## 2021-11-18 ENCOUNTER — Ambulatory Visit: Payer: BC Managed Care – PPO | Admitting: Dermatology

## 2021-12-16 ENCOUNTER — Telehealth: Payer: Self-pay

## 2021-12-16 ENCOUNTER — Other Ambulatory Visit: Payer: Self-pay

## 2021-12-16 MED ORDER — NITROFURANTOIN MONOHYD MACRO 100 MG PO CAPS: 100.0000 mg | ORAL_CAPSULE | Freq: Two times a day (BID) | ORAL | 0 refills | Status: DC

## 2021-12-16 NOTE — Telephone Encounter (Signed)
Pt called that she took 4 tab of AZO not helping she is having UTI symptoms as per dr Humphrey Rolls send macrobid for 7 days and pt aware ?

## 2021-12-18 ENCOUNTER — Ambulatory Visit: Payer: BC Managed Care – PPO | Admitting: Dermatology

## 2022-01-08 ENCOUNTER — Other Ambulatory Visit: Payer: Self-pay

## 2022-01-08 MED FILL — Evolocumab Subcutaneous Soln Auto-Injector 140 MG/ML: SUBCUTANEOUS | 84 days supply | Qty: 6 | Fill #2 | Status: AC

## 2022-01-09 ENCOUNTER — Other Ambulatory Visit: Payer: Self-pay

## 2022-01-10 ENCOUNTER — Other Ambulatory Visit: Payer: Self-pay

## 2022-01-29 ENCOUNTER — Other Ambulatory Visit: Payer: Self-pay

## 2022-01-29 ENCOUNTER — Telehealth: Payer: Self-pay

## 2022-01-29 DIAGNOSIS — Z0000 Encounter for general adult medical examination without abnormal findings: Secondary | ICD-10-CM

## 2022-01-29 DIAGNOSIS — Z0001 Encounter for general adult medical examination with abnormal findings: Secondary | ICD-10-CM

## 2022-01-29 NOTE — Telephone Encounter (Signed)
As per alyssa put labs order for physical  ?

## 2022-02-11 ENCOUNTER — Other Ambulatory Visit: Payer: BC Managed Care – PPO

## 2022-02-11 DIAGNOSIS — Z0000 Encounter for general adult medical examination without abnormal findings: Secondary | ICD-10-CM

## 2022-02-12 LAB — CMP14+EGFR
ALT: 43 IU/L — ABNORMAL HIGH (ref 0–32)
AST: 33 IU/L (ref 0–40)
Albumin/Globulin Ratio: 1.7 (ref 1.2–2.2)
Albumin: 4.7 g/dL (ref 3.8–4.8)
Alkaline Phosphatase: 106 IU/L (ref 44–121)
BUN/Creatinine Ratio: 23 (ref 9–23)
BUN: 16 mg/dL (ref 6–24)
Bilirubin Total: 0.2 mg/dL (ref 0.0–1.2)
CO2: 22 mmol/L (ref 20–29)
Calcium: 9.9 mg/dL (ref 8.7–10.2)
Chloride: 101 mmol/L (ref 96–106)
Creatinine, Ser: 0.7 mg/dL (ref 0.57–1.00)
Globulin, Total: 2.8 g/dL (ref 1.5–4.5)
Glucose: 98 mg/dL (ref 70–99)
Potassium: 4.6 mmol/L (ref 3.5–5.2)
Sodium: 140 mmol/L (ref 134–144)
Total Protein: 7.5 g/dL (ref 6.0–8.5)
eGFR: 107 mL/min/{1.73_m2} (ref >59)

## 2022-02-12 LAB — CBC WITH DIFFERENTIAL/PLATELET
Basophils Absolute: 0.1 10*3/uL (ref 0.0–0.2)
Basos: 1 %
EOS (ABSOLUTE): 0.5 10*3/uL — ABNORMAL HIGH (ref 0.0–0.4)
Eos: 8 %
Hematocrit: 43.3 % (ref 34.0–46.6)
Hemoglobin: 14.8 g/dL (ref 11.1–15.9)
Immature Grans (Abs): 0 10*3/uL (ref 0.0–0.1)
Immature Granulocytes: 1 %
Lymphocytes Absolute: 1.8 10*3/uL (ref 0.7–3.1)
Lymphs: 28 %
MCH: 30.3 pg (ref 26.6–33.0)
MCHC: 34.2 g/dL (ref 31.5–35.7)
MCV: 89 fL (ref 79–97)
Monocytes Absolute: 0.5 10*3/uL (ref 0.1–0.9)
Monocytes: 7 %
Neutrophils Absolute: 3.5 10*3/uL (ref 1.4–7.0)
Neutrophils: 55 %
Platelets: 294 10*3/uL (ref 150–450)
RBC: 4.88 x10E6/uL (ref 3.77–5.28)
RDW: 13.8 % (ref 11.7–15.4)
WBC: 6.3 10*3/uL (ref 3.4–10.8)

## 2022-02-12 LAB — LIPID PANEL WITH LDL/HDL RATIO
Cholesterol, Total: 175 mg/dL (ref 100–199)
HDL: 59 mg/dL (ref >39)
LDL Chol Calc (NIH): 66 mg/dL (ref 0–99)
LDL/HDL Ratio: 1.1 ratio (ref 0.0–3.2)
Triglycerides: 322 mg/dL — ABNORMAL HIGH (ref 0–149)
VLDL Cholesterol Cal: 50 mg/dL — ABNORMAL HIGH (ref 5–40)

## 2022-02-12 LAB — B12 AND FOLATE PANEL
Folate: >20 ng/mL (ref >3.0)
Vitamin B-12: 1944 pg/mL — ABNORMAL HIGH (ref 232–1245)

## 2022-02-12 LAB — VITAMIN D 25 HYDROXY (VIT D DEFICIENCY, FRACTURES): Vit D, 25-Hydroxy: 40 ng/mL (ref 30.0–100.0)

## 2022-02-12 LAB — HGB A1C W/O EAG: Hgb A1c MFr Bld: 5.6 % (ref 4.8–5.6)

## 2022-02-12 LAB — TSH+FREE T4
Free T4: 1.49 ng/dL (ref 0.82–1.77)
TSH: 2.54 u[IU]/mL (ref 0.450–4.500)

## 2022-02-15 NOTE — Progress Notes (Signed)
I have reviewed the lab results. There are no critically abnormal values requiring immediate intervention but there are some abnormals that will be discussed at the next office visit.  

## 2022-02-18 ENCOUNTER — Other Ambulatory Visit: Payer: Self-pay

## 2022-02-18 ENCOUNTER — Encounter: Payer: Self-pay | Admitting: Nurse Practitioner

## 2022-02-18 ENCOUNTER — Ambulatory Visit (INDEPENDENT_AMBULATORY_CARE_PROVIDER_SITE_OTHER): Payer: BC Managed Care – PPO | Admitting: Nurse Practitioner

## 2022-02-18 VITALS — BP 122/79 | HR 75 | Temp 98.3°F | Resp 16 | Ht 60.0 in | Wt 104.6 lb

## 2022-02-18 DIAGNOSIS — Z0001 Encounter for general adult medical examination with abnormal findings: Secondary | ICD-10-CM

## 2022-02-18 DIAGNOSIS — L719 Rosacea, unspecified: Secondary | ICD-10-CM

## 2022-02-18 DIAGNOSIS — N951 Menopausal and female climacteric states: Secondary | ICD-10-CM | POA: Diagnosis not present

## 2022-02-18 DIAGNOSIS — K219 Gastro-esophageal reflux disease without esophagitis: Secondary | ICD-10-CM

## 2022-02-18 DIAGNOSIS — R748 Abnormal levels of other serum enzymes: Secondary | ICD-10-CM

## 2022-02-18 DIAGNOSIS — N959 Unspecified menopausal and perimenopausal disorder: Secondary | ICD-10-CM

## 2022-02-18 DIAGNOSIS — B001 Herpesviral vesicular dermatitis: Secondary | ICD-10-CM

## 2022-02-18 DIAGNOSIS — R300 Dysuria: Secondary | ICD-10-CM

## 2022-02-18 DIAGNOSIS — E782 Mixed hyperlipidemia: Secondary | ICD-10-CM

## 2022-02-18 MED ORDER — DOXYCYCLINE HYCLATE 100 MG PO TABS
100.0000 mg | ORAL_TABLET | Freq: Every day | ORAL | 0 refills | Status: AC
  Filled 2022-02-18 – 2022-02-21 (×2): qty 30, 30d supply, fill #0

## 2022-02-18 MED ORDER — ACYCLOVIR 400 MG PO TABS
400.0000 mg | ORAL_TABLET | Freq: Every day | ORAL | 0 refills | Status: DC
  Filled 2022-02-18: qty 35, 7d supply, fill #0

## 2022-02-18 MED ORDER — TRIAMCINOLONE ACETONIDE 0.1 % EX CREA
1.0000 "application " | TOPICAL_CREAM | Freq: Two times a day (BID) | CUTANEOUS | 1 refills | Status: DC
  Filled 2022-02-18 (×2): qty 30, 15d supply, fill #0

## 2022-02-18 MED ORDER — ESOMEPRAZOLE MAGNESIUM 20 MG PO CPDR
DELAYED_RELEASE_CAPSULE | ORAL | 3 refills | Status: DC
  Filled 2022-02-18: qty 180, 90d supply, fill #0

## 2022-02-18 MED ORDER — TACROLIMUS 0.1 % EX OINT
TOPICAL_OINTMENT | CUTANEOUS | 0 refills | Status: DC
  Filled 2022-02-18: qty 60, 30d supply, fill #0

## 2022-02-18 MED ORDER — PROGESTERONE MICRONIZED 100 MG PO CAPS
ORAL_CAPSULE | ORAL | 5 refills | Status: DC
  Filled 2022-02-18: qty 45, 90d supply, fill #0

## 2022-02-18 NOTE — Progress Notes (Signed)
[facility] [place], [place] [address]  Internal MEDICINE  Office Visit Note  Patient Name: Meredith Carpenter  [id number]  [id number]  Date of Service: 02/18/2022  Chief Complaint  Patient presents with   Annual Exam   Gastroesophageal Reflux   Asthma    HPI Meredith Carpenter presents for an annual well visit and physical exam.  She is a well-appearing 51 year old female with mild intermittent asthma, allergic rhinitis, GERD, vitamin D deficiency, hyperlipidemia and a history of anemia.  She had a normal Pap smear in August last year.  She is due for routine colorectal cancer screening but wants to hold off for now.  She is also due for a routine screening mammogram.  Her last mammogram was in August 2021.  She had a screening mammogram ordered last year but has not had it done yet.  Patient was reminded to call and schedule her mammogram.  No other preventive screenings are due at this time. She lives at home with family.  She is a non-smoker and denies any alcohol or recreational drug use.  She confirms that she does her best to eat a healthy diet and she may go walking for exercise sometimes.  Her blood pressure and other vital signs are within normal limits.  Her BMI and weight are also normal.  She is due for routine labs and is in need of medication refills.  She has no other concerns today.  She denies any new or worsening pain.      Current Medication: Outpatient Encounter Medications as of 02/18/2022  Medication Sig   albuterol (VENTOLIN HFA) 108 (90 Base) MCG/ACT inhaler Inhale 2 puffs into the lungs every 6 (six) hours as needed for wheezing or shortness of breath (cough).   Cholecalciferol (VITAMIN D-3) 1000 units CAPS Take 1 capsule (1,000 Units total) by mouth daily.   cyclobenzaprine (FLEXERIL) 10 MG tablet Take 0.5-1 tablets (5-10 mg total) by mouth daily as needed for muscle spasms.   Docusate Calcium (STOOL SOFTENER PO) Take by mouth as needed.    estradiol (VIVELLE-DOT) 0.025 MG/24HR Place 1 patch onto the skin 2 (two) times a week.   Evolocumab (REPATHA SURECLICK) 952 MG/ML SOAJ INJECT 1 DOSE INTO THE SKIN EVERY 14 (FOURTEEN) DAYS.   ferrous sulfate 325 (65 FE) MG tablet Take 1 tablet (325 mg total) by mouth daily with breakfast.   fluticasone (FLONASE) 50 MCG/ACT nasal spray Place 2 sprays into both nostrils daily.   Multiple Vitamin (MULTIVITAMIN) capsule Take 1 capsule by mouth daily.   [DISCONTINUED] acyclovir (ZOVIRAX) 400 MG tablet Take 1 tablet (400 mg total) by mouth 5 (five) times daily. As needed   [DISCONTINUED] doxycycline (VIBRA-TABS) 100 MG tablet Take 1 tablet (100 mg total) by mouth daily. With food and water   [DISCONTINUED] esomeprazole (NEXIUM) 20 MG capsule TAKE 1 CAPSULE BY MOUTH 2 TIMES DAILY BEFORE A MEAL.   [DISCONTINUED] montelukast (SINGULAIR) 10 MG tablet TAKE 1 TABLET BY MOUTH AT BEDTIME.   [DISCONTINUED] nitrofurantoin, macrocrystal-monohydrate, (MACROBID) 100 MG capsule Take 1 capsule (100 mg total) by mouth 2 (two) times daily.   [DISCONTINUED] progesterone (PROMETRIUM) 100 MG capsule Take 1 capsule by mouth every other day   [DISCONTINUED] tacrolimus (PROTOPIC) 0.1 % ointment Apply 1-2 times to affected areas of  rash on face as needed.   [DISCONTINUED] triamcinolone cream (KENALOG) 0.1 % Apply 1 application topically 2 (two) times daily.   acyclovir (ZOVIRAX) 400 MG tablet Take 1 tablet (400 mg total) by  mouth 5 (five) times daily. As needed   doxycycline (VIBRA-TABS) 100 MG tablet Take 1 tablet (100 mg total) by mouth daily. With food and water   esomeprazole (NEXIUM) 20 MG capsule TAKE 1 CAPSULE BY MOUTH 2 TIMES DAILY BEFORE A MEAL.   progesterone (PROMETRIUM) 100 MG capsule Take 1 capsule by mouth every other day   tacrolimus (PROTOPIC) 0.1 % ointment Apply 1-2 times to affected areas of  rash on face as needed.   triamcinolone cream (KENALOG) 0.1 % Apply 1 application. topically 2 (two) times daily.    [DISCONTINUED] metoprolol tartrate (LOPRESSOR) 100 MG tablet Take ONE tablet two hours prior to test.   No facility-administered encounter medications on file as of 02/18/2022.    Surgical History: Past Surgical History:  Procedure Laterality Date   ESOPHAGOGASTRODUODENOSCOPY  2014   ESOPHAGOGASTRODUODENOSCOPY (EGD) WITH PROPOFOL N/A 01/25/2016   Procedure: ESOPHAGOGASTRODUODENOSCOPY (EGD) WITH PROPOFOL;  Surgeon: Lucilla Lame, MD;  Location: [facility];  Service: Endoscopy;  Laterality: N/A;   TONSILLECTOMY AND ADENOIDECTOMY  1979    Medical History: Past Medical History:  Diagnosis Date   Asthma    GERD (gastroesophageal reflux disease)    Vitamin D deficiency     Family History: Family History  Problem Relation Age of Onset   Hyperlipidemia Mother    Heart disease Mother    Hypertension Mother    Diabetes Mother    Heart attack Mother 41   Heart attack Father 54   Hypertension Brother    Diabetes Brother    Heart disease Brother 95   Heart attack Maternal Uncle    Diabetes Sister    Hypertension Sister    Heart disease Sister 64   Colon cancer Neg Hx    Breast cancer Neg Hx     Social History   Socioeconomic History   Marital status: Married    Spouse name: Not on file   Number of children: 2   Years of education: 16   Highest education level: Not on file  Occupational History   Occupation: Ship broker: [employer]  Tobacco Use   Smoking status: Never   Smokeless tobacco: Never  Vaping Use   Vaping Use: Never used  Substance and Sexual Activity   Alcohol use: No    Alcohol/week: 0.0 standard drinks of alcohol   Drug use: No   Sexual activity: Yes    Partners: Male    Birth control/protection: Condom    Comment: 1 partner   Other Topics Concern   Not on file  Social History Narrative   [name] grew up in [place]. She is married and has 2 children. 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   Social Determinants of Health   Financial Resource Strain: Not on file  Food Insecurity: Not on file  Transportation Needs: Not on file  Physical Activity: Not on file  Stress: Not on file  Social Connections: Not on file  Intimate Partner Violence: Not on file      Review of Systems  Constitutional:  Negative for activity change, appetite change, chills, fatigue, fever and unexpected weight change.  HENT:  Positive for postnasal drip, rhinorrhea and sneezing. Negative for congestion, ear pain, sore throat and trouble swallowing.        Allergies  Eyes: Negative.   Respiratory: Negative.  Negative for cough, chest tightness, shortness of breath and wheezing.   Cardiovascular: Negative.  Negative for  chest pain and palpitations.  Gastrointestinal: Negative.  Negative for abdominal pain, blood in stool, constipation, diarrhea, nausea and vomiting.  Endocrine: Negative.   Genitourinary: Negative.  Negative for difficulty urinating, dysuria, frequency, hematuria and urgency.  Musculoskeletal: Negative.  Negative for arthralgias, back pain, joint swelling, myalgias and neck pain.  Skin: Negative.  Negative for rash and wound.  Allergic/Immunologic: Positive for environmental allergies. Negative for immunocompromised state.  Neurological: Negative.  Negative for dizziness, seizures, numbness and headaches.  Hematological: Negative.   Psychiatric/Behavioral: Negative.  Negative for behavioral problems, self-injury and suicidal ideas. The patient is not nervous/anxious.     Vital Signs: BP 122/79   Pulse 75   Temp 98.3 F (36.8 C)   Resp 16   Ht 5' (1.524 m)   Wt 104 lb 9.6 oz (47.4 kg)   SpO2 99%   BMI 20.43 kg/m    Physical Exam Vitals reviewed.  Constitutional:      General: She is awake. She is not in acute distress.    Appearance: Normal appearance. She is well-developed, well-groomed and normal weight. She is not ill-appearing or diaphoretic.  HENT:      Head: Normocephalic and atraumatic.     Right Ear: Tympanic membrane, ear canal and external ear normal.     Left Ear: Tympanic membrane, ear canal and external ear normal.     Nose: Nose normal. No congestion or rhinorrhea.     Mouth/Throat:     Lips: Pink.     Mouth: Mucous membranes are moist.     Pharynx: Oropharynx is clear. Uvula midline. No oropharyngeal exudate or posterior oropharyngeal erythema.  Eyes:     General: Lids are normal. Vision grossly intact. Gaze aligned appropriately. No scleral icterus.       Right eye: No discharge.        Left eye: No discharge.     Extraocular Movements: Extraocular movements intact.     Conjunctiva/sclera: Conjunctivae normal.     Pupils: Pupils are equal, round, and reactive to light.     Funduscopic exam:    Right eye: Red reflex present.        Left eye: Red reflex present. Neck:     Thyroid: No thyromegaly.     Vascular: No JVD.     Trachea: Trachea and phonation normal. No tracheal deviation.  Cardiovascular:     Rate and Rhythm: Normal rate and regular rhythm.     Pulses: Normal pulses.     Heart sounds: Normal heart sounds, S1 normal and S2 normal. No murmur heard.    No friction rub. No gallop.  Pulmonary:     Effort: Pulmonary effort is normal. No accessory muscle usage or respiratory distress.     Breath sounds: Normal breath sounds and air entry. No stridor. No wheezing or rales.  Chest:     Chest wall: No tenderness.  Breasts:    Breasts are symmetrical.     Right: Normal.     Left: Normal.  Abdominal:     General: Bowel sounds are normal. There is no distension.     Palpations: Abdomen is soft. There is no shifting dullness, fluid wave, mass or pulsatile mass.     Tenderness: There is no abdominal tenderness. There is no guarding or rebound.  Musculoskeletal:        General: No tenderness or deformity. Normal range of motion.     Cervical back: Normal range of motion and neck supple.     Right  lower leg: No edema.      Left lower leg: No edema.  Lymphadenopathy:     Cervical: No cervical adenopathy.     Upper Body:     Right upper body: No supraclavicular, axillary or pectoral adenopathy.     Left upper body: No supraclavicular, axillary or pectoral adenopathy.  Skin:    General: Skin is warm and dry.     Capillary Refill: Capillary refill takes less than 2 seconds.     Coloration: Skin is not pale.     Findings: No erythema or rash.  Neurological:     Mental Status: She is alert and oriented to person, place, and time.     Cranial Nerves: No cranial nerve deficit.     Motor: No abnormal muscle tone.     Coordination: Coordination normal.     Gait: Gait normal.     Deep Tendon Reflexes: Reflexes are normal and symmetric.  Psychiatric:        Mood and Affect: Mood and affect normal.        Behavior: Behavior normal. Behavior is cooperative.        Thought Content: Thought content normal.        Judgment: Judgment normal.        Assessment/Plan: 1. Encounter for routine adult physical exam with abnormal findings Age-appropriate preventive screenings and vaccinations discussed, annual physical exam completed. Routine labs for health maintenance ordered, see below. PHM updated.  Patient reminded to call to schedule her mammogram.  If she does not get the mammogram done by August, she will need a new order placed. - FSH/LH - Hepatic function panel - Lipid Profile - Estradiol - Hgb A1C w/o eAG  2. Menopausal vasomotor syndrome Continue progesterone as prescribed, refills ordered.  Routine labs ordered - progesterone (PROMETRIUM) 100 MG capsule; Take 1 capsule by mouth every other day  Dispense: 45 capsule; Refill: 5 - FSH/LH - Estradiol - Hgb A1C w/o eAG  3. Gastroesophageal reflux disease Esomeprazole refills ordered medication remains effective.  Lab ordered - esomeprazole (NEXIUM) 20 MG capsule; TAKE 1 CAPSULE BY MOUTH 2 TIMES DAILY BEFORE A MEAL.  Dispense: 180 capsule; Refill:  3 - Hgb A1C w/o eAG  4. Elevated liver enzymes Routine labs ordered - Hepatic function panel - Lipid Profile - Hgb A1C w/o eAG  5. Rosacea Oral doxycycline and topical triamcinolone and tacrolimus refills ordered for rosacea - tacrolimus (PROTOPIC) 0.1 % ointment; Apply 1-2 times to affected areas of  rash on face as needed.  Dispense: 60 g; Refill: 0 - triamcinolone cream (KENALOG) 0.1 %; Apply 1 application. topically 2 (two) times daily.  Dispense: 30 g; Refill: 1 - doxycycline (VIBRA-TABS) 100 MG tablet; Take 1 tablet (100 mg total) by mouth daily. With food and water  Dispense: 30 tablet; Refill: 0  6. Recurrent cold sores Oral acyclovir refill ordered - acyclovir (ZOVIRAX) 400 MG tablet; Take 1 tablet (400 mg total) by mouth 5 (five) times daily. As needed  Dispense: 35 tablet; Refill: 0  7. Mixed hyperlipidemia Routine labs ordered - Lipid Profile - Hgb A1C w/o eAG  8. Dysuria Routine urinalysis done - UA/M w/rflx Culture, Routine - Microscopic Examination      General Counseling: Glendi verbalizes understanding of the findings of todays visit and agrees with plan of treatment. I have discussed any further diagnostic evaluation that may be needed or ordered today. We also reviewed her medications today. she has been encouraged to call the office with any  questions or concerns that should arise related to todays visit.    Orders Placed This Encounter  Procedures   Microscopic Examination   UA/M w/rflx Culture, Routine   FSH/LH   Hepatic function panel   Lipid Profile   Estradiol   Hgb A1C w/o eAG    Meds ordered this encounter  Medications   esomeprazole (NEXIUM) 20 MG capsule    Sig: TAKE 1 CAPSULE BY MOUTH 2 TIMES DAILY BEFORE A MEAL.    Dispense:  180 capsule    Refill:  3    Please use good rx card   progesterone (PROMETRIUM) 100 MG capsule    Sig: Take 1 capsule by mouth every other day    Dispense:  45 capsule    Refill:  5   tacrolimus  (PROTOPIC) 0.1 % ointment    Sig: Apply 1-2 times to affected areas of  rash on face as needed.    Dispense:  60 g    Refill:  0   triamcinolone cream (KENALOG) 0.1 %    Sig: Apply 1 application. topically 2 (two) times daily.    Dispense:  30 g    Refill:  1   doxycycline (VIBRA-TABS) 100 MG tablet    Sig: Take 1 tablet (100 mg total) by mouth daily. With food and water    Dispense:  30 tablet    Refill:  0    Patient will call if she needs to have this medication filled   acyclovir (ZOVIRAX) 400 MG tablet    Sig: Take 1 tablet (400 mg total) by mouth 5 (five) times daily. As needed    Dispense:  35 tablet    Refill:  0    Patient will call if she needs a refill, med is for flare up/outbreak    Return in about 3 months (around 05/21/2022) for F/U, Labs, [place] PCP.   Total time spent:30 Minutes Time spent includes review of chart, medications, test results, and follow up plan with the patient.   [place] Controlled Substance Database was reviewed by me.  This patient was seen by Jonetta Osgood, FNP-C in collaboration with Dr. Clayborn Bigness as a part of collaborative care agreement.  Fredda Clarida R. Valetta Fuller, MSN, FNP-C Internal medicine

## 2022-02-19 ENCOUNTER — Other Ambulatory Visit: Payer: Self-pay

## 2022-02-19 LAB — MICROSCOPIC EXAMINATION
Bacteria, UA: NONE SEEN
Casts: NONE SEEN /lpf
Epithelial Cells (non renal): NONE SEEN /hpf (ref 0–10)
WBC, UA: NONE SEEN /hpf (ref 0–5)

## 2022-02-19 LAB — UA/M W/RFLX CULTURE, ROUTINE
Bilirubin, UA: NEGATIVE
Glucose, UA: NEGATIVE
Ketones, UA: NEGATIVE
Leukocytes,UA: NEGATIVE
Nitrite, UA: NEGATIVE
Protein,UA: NEGATIVE
RBC, UA: NEGATIVE
Specific Gravity, UA: 1.011 (ref 1.005–1.030)
Urobilinogen, Ur: 0.2 mg/dL (ref 0.2–1.0)
pH, UA: 7.5 (ref 5.0–7.5)

## 2022-02-21 ENCOUNTER — Other Ambulatory Visit: Payer: Self-pay

## 2022-02-21 ENCOUNTER — Telehealth: Payer: Self-pay

## 2022-02-21 MED FILL — Evolocumab Subcutaneous Soln Auto-Injector 140 MG/ML: SUBCUTANEOUS | 84 days supply | Qty: 6 | Fill #3 | Status: CN

## 2022-02-21 NOTE — Telephone Encounter (Signed)
Pt given sample of Rapatha

## 2022-03-05 ENCOUNTER — Other Ambulatory Visit (HOSPITAL_COMMUNITY): Payer: Self-pay

## 2022-04-10 ENCOUNTER — Encounter: Payer: Self-pay | Admitting: Nurse Practitioner

## 2022-05-02 ENCOUNTER — Other Ambulatory Visit: Payer: Self-pay

## 2022-05-02 MED FILL — Evolocumab Subcutaneous Soln Auto-Injector 140 MG/ML: SUBCUTANEOUS | 28 days supply | Qty: 2 | Fill #3 | Status: CN

## 2022-05-05 ENCOUNTER — Other Ambulatory Visit: Payer: Self-pay

## 2022-05-05 ENCOUNTER — Telehealth: Payer: Self-pay | Admitting: Internal Medicine

## 2022-05-05 MED ORDER — REPATHA SURECLICK 140 MG/ML ~~LOC~~ SOAJ: SUBCUTANEOUS | 1 refills | Status: DC

## 2022-05-05 NOTE — Telephone Encounter (Signed)
PA for repatha submitted via CMM (Key: [id number])

## 2022-05-06 ENCOUNTER — Encounter: Payer: Self-pay | Admitting: Internal Medicine

## 2022-05-13 ENCOUNTER — Encounter: Payer: Self-pay | Admitting: Internal Medicine

## 2022-05-15 NOTE — Telephone Encounter (Signed)
Called insurance company at phone # listed/provided by patient. Was notified by PA rep that additional questions were needing to be completed. This was done over the phone.   Case ID: [id number]  Approved 04/05/22 -- 05/15/23  Total time: 17 minutes

## 2022-05-20 ENCOUNTER — Ambulatory Visit: Payer: Self-pay | Admitting: Nurse Practitioner

## 2022-08-01 ENCOUNTER — Ambulatory Visit: Payer: 59 | Admitting: Family Medicine

## 2022-08-01 ENCOUNTER — Encounter: Payer: Self-pay | Admitting: Family Medicine

## 2022-08-01 VITALS — BP 126/82 | HR 88 | Temp 96.9°F | Ht 60.0 in | Wt 106.0 lb

## 2022-08-01 DIAGNOSIS — E7849 Other hyperlipidemia: Secondary | ICD-10-CM | POA: Diagnosis not present

## 2022-08-01 DIAGNOSIS — K219 Gastro-esophageal reflux disease without esophagitis: Secondary | ICD-10-CM

## 2022-08-01 DIAGNOSIS — L719 Rosacea, unspecified: Secondary | ICD-10-CM | POA: Diagnosis not present

## 2022-08-01 NOTE — Patient Instructions (Addendum)
Thank you for coming to the office today.  Continue with current medications including Nexium '20mg'$  twice a day, I would recommend not changing since it is working and the risks with reduced vitamin mineral absorption are not as severe of a problem  Labs for Meredith Carpenter - Antibody testing Anti-GAD, IA-2, ZnT8   Check if the Radiology here at [facility] is covered in network for insurance  Before next visit in 9 months, let me know about the blood work plans - If Tawni Pummel checks blood in Feb or March 2024, then we can plan on checking the complete panel in August - however we will likely need them to order or we can figure it out.  DUE for FASTING BLOOD WORK (no food or drink after midnight before the lab appointment, only water or coffee without cream/sugar on the morning of)  SCHEDULE "Lab Only" visit in the morning at the clinic for lab draw in 8 MONTHS   - Make sure Lab Only appointment is at about 1 week before your next appointment, so that results will be available  For Lab Results, once available within 2-3 days of blood draw, you can can log in to MyChart online to view your results and a brief explanation. Also, we can discuss results at next follow-up visit.   Please schedule a Follow-up Appointment to: Return in about 9 months (around 05/02/2023) for Annual Physical (Fri afternoon preferred), labs done previously.  If you have any other questions or concerns, please feel free to call the office or send a message through [facility]. You may also schedule an earlier appointment if necessary.  Additionally, you may be receiving a survey about your experience at our office within a few days to 1 week by e-mail or mail. We value your feedback.  [name], DO [place]

## 2022-08-01 NOTE — Progress Notes (Signed)
Subjective:    Patient ID: Meredith Carpenter, female    DOB: [birth year], 51 y.o.   MRN: [id number]  Meredith Carpenter is a 51 y.o. female presenting on 08/01/2022 for Hyperlipidemia and Gastroesophageal Reflux  Re-establish care, previously followed by [facility] here.  HPI  Rosacea Dermatology with stress blistering / skin reaction, rosacea - thought to be stress induced and heat and cold induced rosacea, previously at [facility], had been on variety of medications. Reviewed chart. Now off since creams were irritating her face more and causing burning. She is trying to avoid triggers and limit flares.  Familial Hyperlipidemia History Elevated A1c  Reviewed lab results. From 05/2022  Last lab showed total cholesterol 158, LDL 76, HDL 56, TG 128, much improved continues on Repatha, continues injections No longer seen by Dr Meredith Carpenter since out of network. She follows w/ [facility] now Meredith Pummel NP  A1c was up to 5.8, prior 5.4 to 5.6 range. Attributed to diet. Now improving.  She was doing more treadmill, now doing outdoor 1 mile walk from parking lot to work.  She has significant fam history of DM and HLD Significant fam history of CAD / MI at young age  Failed Crestor (rosuvastatin), Atorvastatin, Pravastatin. Recently failed red yeast rice trial due to myalgias Fam history of early CAD MI age 61-55   - Diet - balanced, vegetarian, does eat frequent fried foods, no sugar added, limits sweets, plant based diet, drinks all water  Not on med for sugar  Denies hypoglycemia, polyuria, numbness tingling     GERD Chronic problem, in past on PPI since age 42 approx, she has had period of time on H2 blocker but was unsuccessful, and switch back to PPI. She says it controls her symptoms but concern about long term use - Taking Nexium '20mg'$  BID. if forget one dose will feel symptoms  Shoulder pain AS NEEDED She took the Cyclobenazprine AS NEEDED  Episodic back pain flare,  using heating pad    History of Allergies / Allergic response urticaria       02/18/2022    9:57 AM 05/21/2021   11:17 AM 04/30/2021   10:06 AM  Depression screen PHQ 2/9  Decreased Interest 0 0 0  Down, Depressed, Hopeless 0 0 0  PHQ - 2 Score 0 0 0    Past Medical History:  Diagnosis Date   Asthma    GERD (gastroesophageal reflux disease)    Vitamin D deficiency    Past Surgical History:  Procedure Laterality Date   ESOPHAGOGASTRODUODENOSCOPY  2014   ESOPHAGOGASTRODUODENOSCOPY (EGD) WITH PROPOFOL N/A 01/25/2016   Procedure: ESOPHAGOGASTRODUODENOSCOPY (EGD) WITH PROPOFOL;  Surgeon: Meredith Lame, MD;  Location: [place];  Service: Endoscopy;  Laterality: N/A;   TONSILLECTOMY AND ADENOIDECTOMY  1979   Social History   Socioeconomic History   Marital status: Married    Spouse name: Not on file   Number of children: 2   Years of education: 16   Highest education level: Not on file  Occupational History   Occupation: Ship broker: [employer]  Tobacco Use   Smoking status: Never   Smokeless tobacco: Never  Vaping Use   Vaping Use: Never used  Substance and Sexual Activity   Alcohol use: No    Alcohol/week: 0.0 standard drinks of alcohol   Drug use: No   Sexual activity: Yes    Partners: Male    Birth control/protection: Condom  Comment: 1 partner   Other Topics Concern   Not on file  Social History Narrative   Meredith Carpenter grew up in [place]. She is married and has 2 children. 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   Social Determinants of Health   Financial Resource Strain: Not on file  Food Insecurity: Not on file  Transportation Needs: Not on file  Physical Activity: Not on file  Stress: Not on file  Social Connections: Not on file  Intimate Partner Violence: Not on file   Family History  Problem Relation Age of Onset   Hyperlipidemia Mother    Heart disease Mother    Hypertension  Mother    Diabetes Mother    Heart attack Mother 30   Heart attack Father 76   Hypertension Brother    Diabetes Brother    Heart disease Brother 69   Heart attack Maternal Uncle    Diabetes Sister    Hypertension Sister    Heart disease Sister 19   Colon cancer Neg Hx    Breast cancer Neg Hx    Current Outpatient Medications on File Prior to Visit  Medication Sig   albuterol (VENTOLIN HFA) 108 (90 Base) MCG/ACT inhaler Inhale 2 puffs into the lungs every 6 (six) hours as needed for wheezing or shortness of breath (cough).   Cholecalciferol (VITAMIN D-3) 1000 units CAPS Take 1 capsule (1,000 Units total) by mouth daily.   Docusate Calcium (STOOL SOFTENER PO) Take by mouth as needed.   esomeprazole (NEXIUM) 20 MG capsule TAKE 1 CAPSULE BY MOUTH 2 TIMES DAILY BEFORE A MEAL.   estradiol (VIVELLE-DOT) 0.025 MG/24HR Place 1 patch onto the skin 2 (two) times a week.   Evolocumab (REPATHA SURECLICK) 017 MG/ML SOAJ INJECT 1 DOSE INTO THE SKIN EVERY 14 (FOURTEEN) DAYS.   ferrous sulfate 325 (65 FE) MG tablet Take 1 tablet (325 mg total) by mouth daily with breakfast.   Multiple Vitamin (MULTIVITAMIN) capsule Take 1 capsule by mouth daily.   progesterone (PROMETRIUM) 100 MG capsule Take 1 capsule by mouth every other day   [DISCONTINUED] metoprolol tartrate (LOPRESSOR) 100 MG tablet Take ONE tablet two hours prior to test.   No current facility-administered medications on file prior to visit.    Review of Systems Per HPI unless specifically indicated above      Objective:    BP 126/82 (BP Location: Right Arm, Patient Position: Sitting, Cuff Size: Small)   Pulse 88   Temp (!) 96.9 F (36.1 C) (Temporal)   Ht 5' (1.524 m)   Wt 106 lb (48.1 kg)   SpO2 100%   BMI 20.70 kg/m   Wt Readings from Last 3 Encounters:  08/01/22 106 lb (48.1 kg)  02/18/22 104 lb 9.6 oz (47.4 kg)  05/21/21 99 lb 9.6 oz (45.2 kg)    Physical Exam Vitals and nursing note reviewed.  Constitutional:       General: She is not in acute distress.    Appearance: Normal appearance. She is well-developed. She is not diaphoretic.     Comments: Well-appearing, comfortable, cooperative  HENT:     Head: Normocephalic and atraumatic.  Eyes:     General:        Right eye: No discharge.        Left eye: No discharge.     Conjunctiva/sclera: Conjunctivae normal.  Cardiovascular:     Rate and Rhythm: Normal rate.  Pulmonary:     Effort: Pulmonary effort is  normal.  Skin:    General: Skin is warm and dry.     Findings: No erythema or rash.  Neurological:     Mental Status: She is alert and oriented to person, place, and time.  Psychiatric:        Mood and Affect: Mood normal.        Behavior: Behavior normal.        Thought Content: Thought content normal.     Comments: Well groomed, good eye contact, normal speech and thoughts    Results for orders placed or performed in visit on 02/18/22  Microscopic Examination   Urine  Result Value Ref Range   WBC, UA None seen 0 - 5 /hpf   RBC, Urine 0-2 0 - 2 /hpf   Epithelial Cells (non renal) None seen 0 - 10 /hpf   Casts None seen None seen /lpf   Bacteria, UA None seen None seen/Few  UA/M w/rflx Culture, Routine   Specimen: Urine   Urine  Result Value Ref Range   Specific Gravity, UA 1.011 1.005 - 1.030   pH, UA 7.5 5.0 - 7.5   Color, UA Yellow Yellow   Appearance Ur Clear Clear   Leukocytes,UA Negative Negative   Protein,UA Negative Negative/Trace   Glucose, UA Negative Negative   Ketones, UA Negative Negative   RBC, UA Negative Negative   Bilirubin, UA Negative Negative   Urobilinogen, Ur 0.2 0.2 - 1.0 mg/dL   Nitrite, UA Negative Negative   Microscopic Examination Comment    Microscopic Examination See below:    Urinalysis Reflex Comment       Assessment & Plan:   Problem List Items Addressed This Visit     Familial hyperlipidemia - Primary   GERD (gastroesophageal reflux disease)   Rosacea    Re-establish care as new  PCP Continue with [facility] for A1c, Familial Hyperlipidemia Improved control on Repatha and lifestyle management A1c slightly elevated last 05/2022 Goal to improve lifestyle, defer medication management of early Pre Diabetes  Rosacea Followed by [facility]all[facility], but now off med Limiting triggers  Following GYN  GERD Continue PPI discussion today on options risk benefit, agree generic nexium PPI '20mg'$  TWICE A DAY is optimal to avoid higher dose and failed H2 in past.  No orders of the defined types were placed in this encounter.     Follow up plan: Return in about 9 months (around 05/02/2023) for Annual Physical (Fri afternoon preferred), labs done previously.  She will message for discussion on her lab orders, if from [facility] or if we order.  [name], [facility] 08/01/2022, 4:11 PM

## 2022-08-05 ENCOUNTER — Ambulatory Visit (HOSPITAL_BASED_OUTPATIENT_CLINIC_OR_DEPARTMENT_OTHER): Payer: BC Managed Care – PPO | Admitting: Internal Medicine

## 2022-08-26 ENCOUNTER — Telehealth: Payer: Self-pay | Admitting: Internal Medicine

## 2022-08-26 NOTE — Telephone Encounter (Signed)
Patient stated she left our practice because she moved, not because she was unhappy here-Meredith Carpenter

## 2022-09-19 ENCOUNTER — Other Ambulatory Visit: Payer: Self-pay | Admitting: Family Medicine

## 2022-09-19 DIAGNOSIS — K219 Gastro-esophageal reflux disease without esophagitis: Secondary | ICD-10-CM

## 2022-09-19 MED ORDER — ESOMEPRAZOLE MAGNESIUM 20 MG PO CPDR: 20.0000 mg | DELAYED_RELEASE_CAPSULE | Freq: Two times a day (BID) | ORAL | 3 refills | Status: DC

## 2022-09-30 ENCOUNTER — Encounter: Payer: Self-pay | Admitting: Family Medicine

## 2022-09-30 DIAGNOSIS — J040 Acute laryngitis: Secondary | ICD-10-CM

## 2022-09-30 DIAGNOSIS — K219 Gastro-esophageal reflux disease without esophagitis: Secondary | ICD-10-CM

## 2022-09-30 LAB — HM MAMMOGRAPHY

## 2022-10-06 MED ORDER — ESOMEPRAZOLE MAGNESIUM 20 MG PO CPDR: 20.0000 mg | DELAYED_RELEASE_CAPSULE | Freq: Two times a day (BID) | ORAL | 3 refills | Status: DC

## 2022-10-06 NOTE — Addendum Note (Signed)
Addended by: Olin Hauser on: 10/06/2022 06:37 PM   Modules accepted: Orders

## 2022-10-07 ENCOUNTER — Telehealth: Payer: Self-pay | Admitting: Family Medicine

## 2022-10-07 MED ORDER — ESOMEPRAZOLE MAGNESIUM 20 MG PO CPDR: 20.0000 mg | DELAYED_RELEASE_CAPSULE | Freq: Every day | ORAL | 3 refills | Status: DC

## 2022-10-07 NOTE — Telephone Encounter (Deleted)
Case Number: [id number]

## 2022-10-07 NOTE — Telephone Encounter (Addendum)
[facility] Medication dept is calling in regards to the patients medication esomeprazole (NEXIUM) 20 MG capsule   Is the pt symptoms controlled by once daily dosing?  Please advise  Case Number: [id number]

## 2022-10-07 NOTE — Telephone Encounter (Signed)
This has already been addressed

## 2022-10-07 NOTE — Addendum Note (Signed)
Addended by: Olin Hauser on: 10/07/2022 09:13 AM   Modules accepted: Orders

## 2022-10-08 ENCOUNTER — Encounter: Payer: Self-pay | Admitting: Family Medicine

## 2022-10-13 ENCOUNTER — Encounter (INDEPENDENT_AMBULATORY_CARE_PROVIDER_SITE_OTHER): Payer: 59 | Admitting: Family Medicine

## 2022-10-13 DIAGNOSIS — U071 COVID-19: Secondary | ICD-10-CM | POA: Diagnosis not present

## 2022-10-13 DIAGNOSIS — J9801 Acute bronchospasm: Secondary | ICD-10-CM

## 2022-10-13 MED ORDER — PREDNISONE 20 MG PO TABS: ORAL_TABLET | ORAL | 0 refills | Status: DC

## 2022-10-13 MED ORDER — NIRMATRELVIR/RITONAVIR (PAXLOVID)TABLET: 3.0000 | ORAL_TABLET | Freq: Two times a day (BID) | ORAL | 0 refills | Status: AC

## 2022-10-13 NOTE — Addendum Note (Signed)
Addended by: Olin Hauser on: 10/13/2022 03:13 PM   Modules accepted: Orders

## 2022-10-13 NOTE — Telephone Encounter (Signed)
Please see the MyChart message reply(ies) for my assessment and plan.    This patient gave consent for this Medical Advice Message and is aware that it may result in a bill to their insurance company, as well as the possibility of receiving a bill for a co-payment or deductible. They are an established patient, but are not seeking medical advice exclusively about a problem treated during an in person or video visit in the last seven days. I did not recommend an in person or video visit within seven days of my reply.    I spent a total of 8 minutes cumulative time within 7 days through MyChart messaging.  Dreana Britz, DO   

## 2022-12-08 ENCOUNTER — Other Ambulatory Visit: Payer: Self-pay | Admitting: Internal Medicine

## 2022-12-09 NOTE — Telephone Encounter (Signed)
Patient is now following with [facility]. Refill refused.

## 2023-02-25 ENCOUNTER — Encounter: Payer: Self-pay | Admitting: Nurse Practitioner

## 2023-03-25 ENCOUNTER — Encounter: Payer: Self-pay | Admitting: Family Medicine

## 2023-03-25 DIAGNOSIS — L719 Rosacea, unspecified: Secondary | ICD-10-CM

## 2023-03-25 DIAGNOSIS — L568 Other specified acute skin changes due to ultraviolet radiation: Secondary | ICD-10-CM

## 2023-04-03 ENCOUNTER — Encounter: Payer: Self-pay | Admitting: Family Medicine

## 2023-04-22 ENCOUNTER — Other Ambulatory Visit (HOSPITAL_COMMUNITY): Payer: Self-pay

## 2023-05-15 ENCOUNTER — Ambulatory Visit (INDEPENDENT_AMBULATORY_CARE_PROVIDER_SITE_OTHER): Payer: 59 | Admitting: Family Medicine

## 2023-05-15 ENCOUNTER — Encounter: Payer: Self-pay | Admitting: Family Medicine

## 2023-05-15 VITALS — BP 120/87 | HR 85 | Temp 96.9°F | Ht 60.0 in | Wt 104.2 lb

## 2023-05-15 DIAGNOSIS — Z0000 Encounter for general adult medical examination without abnormal findings: Secondary | ICD-10-CM | POA: Diagnosis not present

## 2023-05-15 DIAGNOSIS — E7849 Other hyperlipidemia: Secondary | ICD-10-CM

## 2023-05-15 NOTE — Progress Notes (Unsigned)
Subjective:    Patient ID: Meredith Carpenter, female    DOB: Mar 04, 1971, 52 y.o.   MRN: [id number]  Meredith Carpenter is a 52 y.o. female presenting on 05/15/2023 for Annual Exam   HPI  Here for Annual Physical and Lab Review   Familial Hyperlipidemia Elevated A1c   Labs with improved control on Repatha Followed by [facility]   A1c stable at 5.8, prior 5.4 to 5.6 range. Attributed to diet. Now improving. Diet improved. Walking. Less time for other exercise   She has significant fam history of DM and HLD Significant fam history of CAD / MI at young age   Failed Crestor (rosuvastatin), Atorvastatin, Pravastatin. Fam history of early CAD MI age 44-55 On Repatha injections   - Diet - balanced, vegetarian, does eat frequent fried foods, no sugar added, limits sweets, plant based diet, drinks all water   Not on med for sugar   Denies hypoglycemia, polyuria, numbness tingling     GERD Nexium Esomeprazole 20mg  daily, needed regularly  can take extra if needed. 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 has tried variety of treatment options  Tacrolimus 0.03% ointment AS NEEDED, better than the 0.1%   Health Maintenance: Due for Colon CA Screening age 8+, she declines today and prefers to wait until 50+ to reconsider Cologuard     05/15/2023    3:16 PM 02/18/2022    9:57 AM 05/21/2021   11:17 AM  Depression screen PHQ 2/9  Decreased Interest 0 0 0  Down, Depressed, Hopeless 0 0 0  PHQ - 2 Score 0 0 0  Altered sleeping 0    Tired, decreased energy 0    Change in appetite 0    Feeling bad or failure about yourself  0    Trouble concentrating 0    Moving slowly or  fidgety/restless 0    Suicidal thoughts 0    PHQ-9 Score 0    Difficult doing work/chores Not difficult at all      Past Medical History:  Diagnosis Date   Asthma    GERD (gastroesophageal reflux disease)    Vitamin D deficiency    Past Surgical History:  Procedure Laterality Date   ESOPHAGOGASTRODUODENOSCOPY  2014   ESOPHAGOGASTRODUODENOSCOPY (EGD) WITH PROPOFOL N/A 01/25/2016   Procedure: ESOPHAGOGASTRODUODENOSCOPY (EGD) WITH PROPOFOL;  Surgeon: Midge Minium, MD;  Location: [facility];  Service: Endoscopy;  Laterality: N/A;   TONSILLECTOMY AND ADENOIDECTOMY  1979   Social History   Socioeconomic History   Marital status: Married    Spouse name: Not on file   Number of children: 2   Years of education: 16   Highest education level: Not on file  Occupational History   Occupation: Manufacturing engineer: [employer]  Tobacco Use   Smoking status: Never   Smokeless tobacco: Never  Vaping Use   Vaping status: Never Used  Substance and Sexual Activity   Alcohol use: No    Alcohol/week: 0.0 standard drinks of alcohol   Drug use: No   Sexual activity: Yes    Partners: Male    Birth control/protection: Condom    Comment: 1 partner  Other Topics Concern   Not on file  Social History Narrative   Janice Coffin grew up in [place]. She is married and has 2 children. 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   Social Determinants of Health   Financial Resource Strain: Low Risk  (05/15/2023)   Overall Financial Resource Strain (CARDIA)    Difficulty of Paying Living Expenses: Not hard at all  Food Insecurity: No Food Insecurity (06/10/2022)   Received from [facility], [facility]   Hunger Vital Sign    Worried About Running Out of Food in the Last Year: Never true    Ran Out of Food in the Last Year: Never true  Transportation Needs: No Transportation Needs (06/10/2022)   Received from [facility], [facility]   [facility] - Transportation    In the past 12 months, has lack of transportation kept you from medical appointments or from getting medications?: No    Lack of Transportation (Non-Medical): No  Physical Activity: Sufficiently Active (05/15/2023)   Exercise Vital Sign    Days of Exercise per Week: 5 days    Minutes of Exercise per Session: 30 min  Stress: No Stress Concern Present (05/15/2023)   [employer] of Occupational Health - Occupational Stress Questionnaire    Feeling of Stress : Not at all  Social Connections: Socially Integrated (05/15/2023)   Social Connection and Isolation Panel [NHANES]    Frequency of Communication with Friends and Family: More than three times a week    Frequency of Social Gatherings with Friends and Family: More than three times a week    Attends Religious Services: More than 4 times per year    Active Member of [employer] or Organizations: Yes    Attends Engineer, structural: More than 4 times per year    Marital Status: Married  Catering manager Violence: Not At Risk (05/15/2023)   Humiliation, Afraid, Rape, and Kick questionnaire    Fear of Current or Ex-Partner: No    Emotionally Abused: No    Physically Abused: No    Sexually Abused: No   Family History  Problem Relation Age of Onset   Hyperlipidemia Mother    Heart disease Mother    Hypertension Mother    Diabetes Mother    Heart attack Mother 26   Heart attack Father 52   Hypertension Brother    Diabetes Brother    Heart disease Brother 40   Heart attack Maternal Uncle    Diabetes Sister    Hypertension Sister    Heart disease Sister 60   Colon cancer Neg Hx    Breast cancer Neg Hx    Current Outpatient Medications on File Prior to Visit  Medication Sig   albuterol (VENTOLIN HFA) 108 (90 Base) MCG/ACT inhaler Inhale 2 puffs into the lungs every 6 (six) hours as needed for wheezing or shortness of breath (cough).    Cholecalciferol (VITAMIN D-3) 1000 units CAPS Take 1 capsule (1,000 Units total) by mouth daily.   Docusate Calcium (STOOL SOFTENER PO) Take by mouth as needed.   doxycycline (ORACEA) 40 MG capsule Take by mouth.   esomeprazole (NEXIUM) 20 MG capsule Take 1 capsule (20 mg total) by mouth daily before breakfast.   estradiol (VIVELLE-DOT) 0.025 MG/24HR Place 1 patch onto the skin 2 (two) times a week.   Evolocumab (REPATHA SURECLICK) 140 MG/ML SOAJ INJECT 1 DOSE INTO THE SKIN EVERY 14 (FOURTEEN)  DAYS.   ferrous sulfate 325 (65 FE) MG tablet Take 1 tablet (325 mg total) by mouth daily with breakfast.   Multiple Vitamin (MULTIVITAMIN) capsule Take 1 capsule by mouth daily.   progesterone (PROMETRIUM) 100 MG capsule Take 1 capsule by mouth every other day   tacrolimus (PROTOPIC) 0.03 % ointment Apply twice daily to affected areas as needed for rash on eyelids   [DISCONTINUED] metoprolol tartrate (LOPRESSOR) 100 MG tablet Take ONE tablet two hours prior to test.   No current facility-administered medications on file prior to visit.    Review of Systems  Constitutional:  Negative for activity change, appetite change, chills, diaphoresis, fatigue and fever.  HENT:  Negative for congestion and hearing loss.   Eyes:  Negative for visual disturbance.  Respiratory:  Negative for cough, chest tightness, shortness of breath and wheezing.   Cardiovascular:  Negative for chest pain, palpitations and leg swelling.  Gastrointestinal:  Negative for abdominal pain, constipation, diarrhea, nausea and vomiting.  Genitourinary:  Negative for dysuria, frequency and hematuria.  Musculoskeletal:  Negative for arthralgias and neck pain.  Skin:  Negative for rash.  Neurological:  Negative for dizziness, weakness, light-headedness, numbness and headaches.  Hematological:  Negative for adenopathy.  Psychiatric/Behavioral:  Negative for behavioral problems, dysphoric mood and sleep disturbance.    Per HPI unless  specifically indicated above      Objective:    BP 120/87   Pulse 85   Temp (!) 96.9 F (36.1 C) (Temporal)   Ht 5' (1.524 m)   Wt 104 lb 3.2 oz (47.3 kg)   SpO2 100%   BMI 20.35 kg/m   Wt Readings from Last 3 Encounters:  05/15/23 104 lb 3.2 oz (47.3 kg)  08/01/22 106 lb (48.1 kg)  02/18/22 104 lb 9.6 oz (47.4 kg)    Physical Exam Vitals and nursing note reviewed.  Constitutional:      General: She is not in acute distress.    Appearance: She is well-developed. She is not diaphoretic.     Comments: Well-appearing, comfortable, cooperative  HENT:     Head: Normocephalic and atraumatic.     Right Ear: Tympanic membrane, ear canal and external ear normal. There is no impacted cerumen.     Left Ear: Tympanic membrane, ear canal and external ear normal. There is no impacted cerumen.  Eyes:     General:        Right eye: No discharge.        Left eye: No discharge.     Conjunctiva/sclera: Conjunctivae normal.     Pupils: Pupils are equal, round, and reactive to light.  Neck:     Thyroid: No thyromegaly.     Vascular: No carotid bruit.  Cardiovascular:     Rate and Rhythm: Normal rate and regular rhythm.     Pulses: Normal pulses.     Heart sounds: Normal heart sounds. No murmur heard. Pulmonary:     Effort: Pulmonary effort is normal. No respiratory distress.     Breath sounds: Normal breath sounds. No wheezing or rales.  Abdominal:     General: Bowel sounds are normal. There is no distension.     Palpations: Abdomen is soft. There is no mass.     Tenderness: There is no abdominal tenderness.  Musculoskeletal:        General: No tenderness. Normal range of motion.     Cervical back: Normal range of motion and neck supple.     Right lower leg: No  edema.     Left lower leg: No edema.     Comments: Upper / Lower Extremities: - Normal muscle tone, strength bilateral upper extremities 5/5, lower extremities 5/5  Lymphadenopathy:     Cervical: No cervical adenopathy.   Skin:    General: Skin is warm and dry.     Findings: No erythema or rash.  Neurological:     Mental Status: She is alert and oriented to person, place, and time.     Comments: Distal sensation intact to light touch all extremities  Psychiatric:        Mood and Affect: Mood normal.        Behavior: Behavior normal.        Thought Content: Thought content normal.     Comments: Well groomed, good eye contact, normal speech and thoughts    Results for orders placed or performed in visit on 05/15/23  HM MAMMOGRAPHY  Result Value Ref Range   HM Mammogram 0-4 Bi-Rad 0-4 Bi-Rad, Self Reported Normal      Assessment & Plan:   Problem List Items Addressed This Visit     Familial hyperlipidemia    Controlled cholesterol on Repatha Suspected fam history of hyperlipidemia The 10-year ASCVD risk score (Arnett DK, et al., 2019) is: 0.7% Failed Crestor myalgias (even 5mg  intermittent dosing)  Plan: Continue Repatha Encourage improved lifestyle - low carb/cholesterol, continue improving regular exercise Follow-up 1 year lipids      Other Visit Diagnoses     Annual physical exam    -  Primary       Updated Health Maintenance information Reviewed recent lab results with patient Encouraged improvement to lifestyle with diet and exercise Goal of weight loss  A1c 5.8, stable for past 1 year. Encourage continued management with lifestyle and diet No medication indicated  Recommend A1c every 6 months to monitor We can review the labs drawn at [facility]    No orders of the defined types were placed in this encounter.     Follow up plan: Return in about 1 year (around 05/14/2024) for 1 year Annual Physical (Labs done at [facility]).  Saralyn Pilar, DO [facility] 05/15/2023, 3:32 PM

## 2023-05-15 NOTE — Patient Instructions (Addendum)
Thank you for coming to the office today.  Recommend A1c every 6 months to monitor A1c 5.8 previously.  We can review the labs drawn at [facility] and discuss in 6 months.  Overall keep improving lifestyle as you are and managing cholesterol and sugar.  Repatha is working well. No changes at this time.   Colon Cancer Screening: - For all adults age 50+ routine colon cancer screening is highly recommended.     - Recent guidelines from American Cancer Society recommend starting age of 74 - Early detection of colon cancer is important, because often there are no warning signs or symptoms, also if found early usually it can be cured. Late stage is hard to treat.  - If you are not interested in Colonoscopy screening (if done and normal you could be cleared for 5 to 10 years until next due), then Cologuard is an excellent alternative for screening test for Colon Cancer. It is highly sensitive for detecting DNA of colon cancer from even the earliest stages. Also, there is NO bowel prep required. - If Cologuard is NEGATIVE, then it is good for 3 years before next due - If Cologuard is POSITIVE, then it is strongly advised to get a Colonoscopy, which allows the GI doctor to locate the source of the cancer or polyp (even very early stage) and treat it by removing it. ------------------------- If you would like to proceed with Cologuard (stool DNA test) - FIRST, call your insurance company and tell them you want to check cost of Cologuard tell them CPT Code 16109 (it may be completely covered and you could get for no cost, OR max cost without any coverage is about $600). Also, keep in mind if you do NOT open the kit, and decide not to do the test, you will NOT be charged, you should contact the company if you decide not to do the test. - If you want to proceed, you can notify us (phone message, MyChart Message, or at next visit) and we will order it for you. The test kit will be delivered to you house within  about 1 week. Follow instructions to collect sample, you may call the company for any help or questions, 24/7 telephone support at [phone].   Please schedule a Follow-up Appointment to: Return in about 1 year (around 05/14/2024) for 1 year Annual Physical (Labs done at [facility]).  If you have any other questions or concerns, please feel free to call the office or send a message through MyChart. You may also schedule an earlier appointment if necessary.  Additionally, you may be receiving a survey about your experience at our office within a few days to 1 week by e-mail or mail. We value your feedback.  Saralyn Pilar, DO [facility], [place]

## 2023-05-16 NOTE — Assessment & Plan Note (Signed)
Controlled cholesterol on Repatha Suspected fam history of hyperlipidemia The 10-year ASCVD risk score (Arnett DK, et al., 2019) is: 0.7% Failed Crestor myalgias (even 5mg  intermittent dosing)  Plan: Continue Repatha Encourage improved lifestyle - low carb/cholesterol, continue improving regular exercise Follow-up 1 year lipids

## 2023-11-02 ENCOUNTER — Encounter: Payer: Self-pay | Admitting: Family Medicine

## 2023-12-09 ENCOUNTER — Other Ambulatory Visit: Payer: Self-pay | Admitting: Family Medicine

## 2023-12-09 ENCOUNTER — Encounter: Payer: Self-pay | Admitting: Family Medicine

## 2023-12-09 DIAGNOSIS — K219 Gastro-esophageal reflux disease without esophagitis: Secondary | ICD-10-CM

## 2023-12-09 DIAGNOSIS — K2930 Chronic superficial gastritis without bleeding: Secondary | ICD-10-CM

## 2023-12-09 MED ORDER — DEXLANSOPRAZOLE 30 MG PO CPDR: 30.0000 mg | DELAYED_RELEASE_CAPSULE | Freq: Every day | ORAL | 2 refills | Status: DC

## 2023-12-09 NOTE — Telephone Encounter (Signed)
 Requested medication (s) are due for refill today: yes  Requested medication (s) are on the active medication list: yes  Last refill:  10/07/22 #90/3  Future visit scheduled: yes  Notes to clinic:  Unable to refill per protocol due to failed labs, no updated results.      Requested Prescriptions  Pending Prescriptions Disp Refills   esomeprazole (NEXIUM) 20 MG capsule [Pharmacy Med Name: esomeprazole magnesium 20 mg capsule,delayed release (NexIUM)] 180 capsule 3    Sig: Take 1 capsule (20 mg total) by mouth 2 (two) times daily before a meal.     Gastroenterology: Proton Pump Inhibitors 2 Failed - 12/09/2023  5:15 PM      Failed - ALT in normal range and within 360 days    ALT  Date Value Ref Range Status  02/11/2022 43 (H) 0 - 32 IU/L Final         Failed - AST in normal range and within 360 days    AST  Date Value Ref Range Status  02/11/2022 33 0 - 40 IU/L Final         Passed - Valid encounter within last 12 months    Recent Outpatient Visits           6 months ago Annual physical exam   [place] [facility] Smitty Cords, DO   1 year ago Familial hyperlipidemia   [place] [facility] Smitty Cords, DO   3 years ago Annual physical exam   [place] [facility], Netta Neat, DO   3 years ago Acute right-sided low back pain without sciatica   [place] [facility] Smitty Cords, DO   4 years ago Annual physical exam   [place] [facility] Smitty Cords, DO       Future Appointments             In 5 months Althea Charon, Netta Neat, DO [facility]

## 2023-12-10 ENCOUNTER — Telehealth: Payer: Self-pay

## 2023-12-10 NOTE — Telephone Encounter (Signed)
 Pa started

## 2024-01-06 ENCOUNTER — Other Ambulatory Visit: Payer: Self-pay | Admitting: Family Medicine

## 2024-01-06 DIAGNOSIS — Z20828 Contact with and (suspected) exposure to other viral communicable diseases: Secondary | ICD-10-CM

## 2024-01-06 MED ORDER — OSELTAMIVIR PHOSPHATE 75 MG PO CAPS: 75.0000 mg | ORAL_CAPSULE | Freq: Every day | ORAL | 0 refills | Status: DC

## 2024-01-27 ENCOUNTER — Encounter: Payer: Self-pay | Admitting: Family Medicine

## 2024-01-27 DIAGNOSIS — J9801 Acute bronchospasm: Secondary | ICD-10-CM

## 2024-01-27 MED ORDER — ALBUTEROL SULFATE HFA 108 (90 BASE) MCG/ACT IN AERS: 2.0000 | INHALATION_SPRAY | Freq: Four times a day (QID) | RESPIRATORY_TRACT | 3 refills | Status: AC | PRN

## 2024-02-24 ENCOUNTER — Encounter: Payer: Self-pay | Admitting: Family Medicine

## 2024-02-24 DIAGNOSIS — K219 Gastro-esophageal reflux disease without esophagitis: Secondary | ICD-10-CM

## 2024-02-24 MED ORDER — ESOMEPRAZOLE MAGNESIUM 20 MG PO CPDR: 20.0000 mg | DELAYED_RELEASE_CAPSULE | Freq: Two times a day (BID) | ORAL | 1 refills | Status: DC

## 2024-02-25 ENCOUNTER — Telehealth: Payer: Self-pay

## 2024-02-25 NOTE — Telephone Encounter (Signed)
 Pharmacy Patient Advocate Encounter   Received notification from CoverMyMeds that prior authorization for Esomeprazole  Magnesium  20MG  dr capsules is required/requested.   Insurance verification completed.   The patient is insured through [employer] .   Per test claim: PA required; PA submitted to above mentioned insurance via CoverMyMeds Key/confirmation #/EOC [id number] Status is pending

## 2024-03-01 NOTE — Telephone Encounter (Signed)
 Pharmacy Patient Advocate Encounter  Received notification from [employer] that Prior Authorization for Esomeprazole  Magnesium  20MG  dr capsules has been APPROVED from 01/26/2024 to 02/24/2025   PA #/Case ID/Reference #: [id number]

## 2024-05-16 ENCOUNTER — Encounter: Payer: Self-pay | Admitting: Family Medicine

## 2024-05-20 ENCOUNTER — Encounter: Payer: Self-pay | Admitting: Family Medicine

## 2024-05-20 ENCOUNTER — Ambulatory Visit (INDEPENDENT_AMBULATORY_CARE_PROVIDER_SITE_OTHER): Payer: Self-pay | Admitting: Family Medicine

## 2024-05-20 VITALS — BP 116/70 | HR 77 | Ht 60.0 in | Wt 103.2 lb

## 2024-05-20 DIAGNOSIS — Z0000 Encounter for general adult medical examination without abnormal findings: Secondary | ICD-10-CM | POA: Diagnosis not present

## 2024-05-20 DIAGNOSIS — K219 Gastro-esophageal reflux disease without esophagitis: Secondary | ICD-10-CM

## 2024-05-20 MED ORDER — ESOMEPRAZOLE MAGNESIUM 20 MG PO CPDR
20.0000 mg | DELAYED_RELEASE_CAPSULE | Freq: Two times a day (BID) | ORAL | 3 refills | Status: DC
Start: 2024-05-20 — End: 2024-05-21

## 2024-05-20 NOTE — Progress Notes (Signed)
 Subjective:    Patient ID: Meredith Carpenter, female    DOB: 17-Apr-1971, 53 y.o.   MRN: [id number]  REAH JUSTO is a 53 y.o. female presenting on 05/20/2024 for Annual Exam   HPI  Discussed the use of AI scribe software for clinical note transcription with the patient, who gave verbal consent to proceed.  History of Present Illness   Meredith Carpenter is a 53 year old female who presents for an annual physical exam.   Immunization considerations - Considering future vaccinations, including the shingles vaccine - Postponing shingles vaccination due to current life stressors     Recent family history update Mother diagnosed with stage 4 liver cancer, undergoing chemotherapy, immunotherapy, radiation therapy Patient experiencing significant stress at this time Interested in potential therapist in future   Familial Hyperlipidemia Elevated A1c   Labs with improved control on Repatha  Followed by [facility]   A1c stable at 5.6, prior 5.4 to 5.8 range. Attributed to diet success Diet improved. Walking. Less time for other exercise   She has significant fam history of DM and HLD Significant fam history of CAD / MI at young age   Failed Crestor  (rosuvastatin ), Atorvastatin, Pravastatin. Fam history of early CAD MI age 23-55 On Repatha  injections   - Diet - balanced, vegetarian, does eat frequent fried foods, no sugar added, limits sweets, plant based diet, drinks all water     Denies hypoglycemia, polyuria, numbness tingling     GERD Nexium  Esomeprazole  20mg  daily, needed regularly Failed Dexilant  Interested in Endoscopy Chronic PPI for years   Shoulder pain AS NEEDED She took the Cyclobenazprine AS NEEDED   Episodic back pain flare, using heating pad    History of Allergies / Allergic response urticaria     Health Maintenance: Due for Colon CA Screening age 16+      05/20/2024    5:38 PM 05/15/2023    3:16 PM 02/18/2022    9:57 AM  Depression  screen PHQ 2/9  Decreased Interest 0 0 0  Down, Depressed, Hopeless 0 0 0  PHQ - 2 Score 0 0 0  Altered sleeping  0   Tired, decreased energy  0   Change in appetite  0   Feeling bad or failure about yourself   0   Trouble concentrating  0   Moving slowly or fidgety/restless  0   Suicidal thoughts  0   PHQ-9 Score  0   Difficult doing work/chores  Not difficult at all        05/20/2024    5:38 PM 05/15/2023    3:16 PM 08/16/2020    3:44 PM  GAD 7 : Generalized Anxiety Score  Nervous, Anxious, on Edge 0 0 1  Control/stop worrying 0 0 1  Worry too much - different things 0 0 1  Trouble relaxing 0 0 0  Restless 0 0 0  Easily annoyed or irritable 0 0 1  Afraid - awful might happen 0 0 1  Total GAD 7 Score 0 0 5  Anxiety Difficulty Not difficult at all Not difficult at all Not difficult at all     Past Medical History:  Diagnosis Date   Asthma    GERD (gastroesophageal reflux disease)    Vitamin D  deficiency    Past Surgical History:  Procedure Laterality Date   ESOPHAGOGASTRODUODENOSCOPY  2014   ESOPHAGOGASTRODUODENOSCOPY (EGD) WITH PROPOFOL  N/A 01/25/2016   Procedure: ESOPHAGOGASTRODUODENOSCOPY (EGD) WITH PROPOFOL ;  Surgeon: Rogelia Copping, MD;  Location:  [facility];  Service: Endoscopy;  Laterality: N/A;   TONSILLECTOMY AND ADENOIDECTOMY  1979   Social History   Socioeconomic History   Marital status: Married    Spouse name: Not on file   Number of children: 2   Years of education: 16   Highest education level: Not on file  Occupational History   Occupation: Manufacturing engineer: [employer]  Tobacco Use   Smoking status: Never   Smokeless tobacco: Never  Vaping Use   Vaping status: Never Used  Substance and Sexual Activity   Alcohol use: No    Alcohol/week: 0.0 standard drinks of alcohol   Drug use: No   Sexual activity: Yes    Partners: Male    Birth control/protection: Condom    Comment: 1 partner   Other Topics  Concern   Not on file  Social History Narrative   Meredith Carpenter grew up in [place]. She is married and has 2 children. 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   Social Drivers of Corporate investment banker Strain: Low Risk  (05/15/2023)   Overall Financial Resource Strain (CARDIA)    Difficulty of Paying Living Expenses: Not hard at all  Food Insecurity: No Food Insecurity (03/02/2024)   Received from [facility]   Hunger Vital Sign    Within the past 12 months, you worried that your food would run out before you got the money to buy more.: Never true    Within the past 12 months, the food you bought just didn't last and you didn't have money to get more.: Never true  Transportation Needs: No Transportation Needs (03/02/2024)   Received from [facility] - Transportation    In the past 12 months, has lack of transportation kept you from medical appointments or from getting medications?: No    Lack of Transportation (Non-Medical): No  Physical Activity: Sufficiently Active (05/15/2023)   Exercise Vital Sign    Days of Exercise per Week: 5 days    Minutes of Exercise per Session: 30 min  Stress: No Stress Concern Present (05/15/2023)   [employer] of Occupational Health - Occupational Stress Questionnaire    Feeling of Stress : Not at all  Social Connections: Socially Integrated (05/15/2023)   Social Connection and Isolation Panel    Frequency of Communication with Friends and Family: More than three times a week    Frequency of Social Gatherings with Friends and Family: More than three times a week    Attends Religious Services: More than 4 times per year    Active Member of [employer] or Organizations: Yes    Attends Banker Meetings: More than 4 times per year    Marital Status: Married  Catering manager Violence: Not At Risk (05/15/2023)   Humiliation, Afraid, Rape, and Kick questionnaire    Fear of Current  or Ex-Partner: No    Emotionally Abused: No    Physically Abused: No    Sexually Abused: No   Family History  Problem Relation Age of Onset   Hyperlipidemia Mother    Heart disease Mother    Hypertension Mother    Diabetes Mother    Heart attack Mother 79   Liver cancer Mother    Heart attack Father 73   Diabetes Sister    Hypertension Sister    Heart disease Sister 78   Hypertension Brother  Diabetes Brother    Heart disease Brother 40   Heart attack Maternal Uncle    Colon cancer Neg Hx    Breast cancer Neg Hx    Current Outpatient Medications on File Prior to Visit  Medication Sig   albuterol  (VENTOLIN  HFA) 108 (90 Base) MCG/ACT inhaler Inhale 2 puffs into the lungs every 6 (six) hours as needed for wheezing or shortness of breath.   Cholecalciferol (VITAMIN D -3) 1000 units CAPS Take 1 capsule (1,000 Units total) by mouth daily.   Docusate Calcium  (STOOL SOFTENER PO) Take by mouth as needed.   Estradiol  1.25 MG/1.25GM GEL Place 1.25 mg onto the skin.   Evolocumab  (REPATHA  SURECLICK) 140 MG/ML SOAJ Inject 140 mg into the skin every 14 (fourteen) days.   ferrous sulfate  325 (65 FE) MG tablet Take 1 tablet (325 mg total) by mouth daily with breakfast.   Multiple Vitamin (MULTIVITAMIN) capsule Take 1 capsule by mouth daily.   progesterone  (PROMETRIUM ) 100 MG capsule Take 100 mg by mouth.   estradiol  (ESTRACE ) 0.1 MG/GM vaginal cream Place 1 Applicatorful vaginally.   tacrolimus  (PROTOPIC ) 0.03 % ointment Apply twice daily to affected areas as needed for rash on eyelids (Patient not taking: Reported on 05/20/2024)   [DISCONTINUED] metoprolol  tartrate (LOPRESSOR ) 100 MG tablet Take ONE tablet two hours prior to test.   No current facility-administered medications on file prior to visit.    Review of Systems  Constitutional:  Negative for activity change, appetite change, chills, diaphoresis, fatigue and fever.  HENT:  Negative for congestion and hearing loss.   Eyes:   Negative for visual disturbance.  Respiratory:  Negative for cough, chest tightness, shortness of breath and wheezing.   Cardiovascular:  Negative for chest pain, palpitations and leg swelling.  Gastrointestinal:  Negative for abdominal pain, constipation, diarrhea, nausea and vomiting.  Genitourinary:  Negative for dysuria, frequency and hematuria.  Musculoskeletal:  Negative for arthralgias and neck pain.  Skin:  Negative for rash.  Neurological:  Negative for dizziness, weakness, light-headedness, numbness and headaches.  Hematological:  Negative for adenopathy.  Psychiatric/Behavioral:  Negative for behavioral problems, dysphoric mood and sleep disturbance.    Per HPI unless specifically indicated above     Objective:    BP 116/70   Pulse 77   Ht 5' (1.524 m)   Wt 103 lb 4 oz (46.8 kg)   SpO2 100%   BMI 20.16 kg/m   Wt Readings from Last 3 Encounters:  05/20/24 103 lb 4 oz (46.8 kg)  05/15/23 104 lb 3.2 oz (47.3 kg)  08/01/22 106 lb (48.1 kg)    Physical Exam Vitals and nursing note reviewed.  Constitutional:      General: She is not in acute distress.    Appearance: She is well-developed. She is not diaphoretic.     Comments: Well-appearing, comfortable, cooperative  HENT:     Head: Normocephalic and atraumatic.     Right Ear: Tympanic membrane, ear canal and external ear normal. There is no impacted cerumen.     Left Ear: Tympanic membrane, ear canal and external ear normal. There is no impacted cerumen.  Eyes:     General:        Right eye: No discharge.        Left eye: No discharge.     Conjunctiva/sclera: Conjunctivae normal.     Pupils: Pupils are equal, round, and reactive to light.  Neck:     Thyroid : No thyromegaly.     Vascular: No carotid  bruit.  Cardiovascular:     Rate and Rhythm: Normal rate and regular rhythm.     Pulses: Normal pulses.     Heart sounds: Normal heart sounds. No murmur heard. Pulmonary:     Effort: Pulmonary effort is normal. No  respiratory distress.     Breath sounds: Normal breath sounds. No wheezing or rales.  Abdominal:     General: Bowel sounds are normal. There is no distension.     Palpations: Abdomen is soft. There is no mass.     Tenderness: There is no abdominal tenderness.  Musculoskeletal:        General: No tenderness. Normal range of motion.     Cervical back: Normal range of motion and neck supple.     Right lower leg: No edema.     Left lower leg: No edema.     Comments: Upper / Lower Extremities: - Normal muscle tone, strength bilateral upper extremities 5/5, lower extremities 5/5  Lymphadenopathy:     Cervical: No cervical adenopathy.  Skin:    General: Skin is warm and dry.     Findings: No erythema or rash.  Neurological:     Mental Status: She is alert and oriented to person, place, and time.     Comments: Distal sensation intact to light touch all extremities  Psychiatric:        Mood and Affect: Mood normal.        Behavior: Behavior normal.        Thought Content: Thought content normal.     Comments: Well groomed, good eye contact, normal speech and thoughts     Results for orders placed or performed in visit on 05/15/23  HM MAMMOGRAPHY   Collection Time: 09/30/22 12:00 AM  Result Value Ref Range   HM Mammogram 0-4 Bi-Rad 0-4 Bi-Rad, Self Reported Normal      Assessment & Plan:   Problem List Items Addressed This Visit   None Visit Diagnoses       Annual physical exam    -  Primary        Updated Health Maintenance information Reviewed recent lab results with patient Encouraged improvement to lifestyle with diet and exercise    Adult Wellness Visit Annual wellness visit completed. Good health, stable labs, no significant concerns. - Encourage flu vaccination at local pharmacy. - Discussed shingles vaccine; she prefers to wait until next year. - Arrange endoscopy and colonoscopy with [facility]. - No repeat heart scan needed; previous calcium  score was  zero.  Familial Hyperlipidemia Hyperlipidemia controlled with Repatha . LDL at 60, stable. Triglycerides at 152, not concerning. - Continue Repatha  every 14 days.  Pre-diabetes Pre-diabetes with A1c at 5.6%. Maintains healthy lifestyle despite limited exercise. - Monitor A1c annually.  Gastroesophageal reflux disease GERD managed with Nexium . Long-term PPI use discussed; interested in alternatives during endoscopy. Failed Dexilant  side effects - Continue Nexium  as needed, 20mg  dosing typically once daily, occasionally twice. - Discuss long-term PPI use with GI during endoscopy.  Asthma, intermittent Intermittent asthma. Rescue inhaler available for respiratory infections. - Ensure rescue inhaler has refills available.         No orders of the defined types were placed in this encounter.    Follow up plan: Return in about 1 year (around 05/20/2025) for Annual Physical Fri afternoon 340pm.  Marsa Officer, DO [facility] 05/20/2024, 4:21 PM

## 2024-05-20 NOTE — Patient Instructions (Addendum)
 Thank you for coming to the office today.  We have referred you to [facility] for Colonoscopy and Endoscopy.  ----------------------  These offices have both PSYCHIATRY doctors and THERAPISTS  [facility] (Virtual Available) [facility] [address] [place], [place] [address] Phone: [phone]  [facility] Address: [address], [place], [place] [address] [email or web address] Phone:  [phone]  ----------------------------------------------------------------- THERAPIST ONLY  (No Psychiatry)  [facility] [address] [place], [place] [address] [place]   [phone]   Please schedule a Follow-up Appointment to: Return in about 1 year (around 05/20/2025) for Annual Physical Fri afternoon 340pm.  If you have any other questions or concerns, please feel free to call the office or send a message through MyChart. You may also schedule an earlier appointment if necessary.  Additionally, you may be receiving a survey about your experience at our office within a few days to 1 week by e-mail or mail. We value your feedback.  Marsa Officer, DO [facility], [place]

## 2024-05-21 ENCOUNTER — Other Ambulatory Visit: Payer: Self-pay | Admitting: Family Medicine

## 2024-05-21 ENCOUNTER — Encounter: Payer: Self-pay | Admitting: Family Medicine

## 2024-05-21 DIAGNOSIS — Z1211 Encounter for screening for malignant neoplasm of colon: Secondary | ICD-10-CM

## 2024-05-21 DIAGNOSIS — K219 Gastro-esophageal reflux disease without esophagitis: Secondary | ICD-10-CM

## 2024-05-21 NOTE — Addendum Note (Signed)
 Addended by: EDMAN MARSA PARAS on: 05/21/2024 01:54 AM   Modules accepted: Orders

## 2024-08-22 ENCOUNTER — Encounter: Payer: Self-pay | Admitting: *Deleted

## 2024-08-26 ENCOUNTER — Other Ambulatory Visit: Payer: Self-pay | Admitting: Medical Genetics

## 2024-08-27 ENCOUNTER — Other Ambulatory Visit
Admission: RE | Admit: 2024-08-27 | Discharge: 2024-08-27 | Disposition: A | Discharge status: Home / Self Care | Payer: Self-pay | Source: Ambulatory Visit | Attending: Medical Genetics | Admitting: Medical Genetics

## 2024-09-02 ENCOUNTER — Ambulatory Visit: Admitting: Certified Registered"

## 2024-09-02 ENCOUNTER — Ambulatory Visit
Admission: RE | Admit: 2024-09-02 | Discharge: 2024-09-02 | Disposition: A | Attending: Gastroenterology | Admitting: Gastroenterology

## 2024-09-02 ENCOUNTER — Encounter: Admission: RE | Disposition: A | Payer: Self-pay | Source: Home / Self Care | Attending: Gastroenterology

## 2024-09-02 HISTORY — DX: Herpesviral vesicular dermatitis: B00.1

## 2024-09-02 HISTORY — DX: Gastro-esophageal reflux disease without esophagitis: K21.9

## 2024-09-02 HISTORY — DX: Rosacea, unspecified: L71.9

## 2024-09-02 HISTORY — DX: Dry eye syndrome of bilateral lacrimal glands: H04.123

## 2024-09-02 HISTORY — DX: Abnormal uterine and vaginal bleeding, unspecified: N93.9

## 2024-09-02 HISTORY — DX: Miosis: H57.03

## 2024-09-02 HISTORY — DX: Hypermetropia, bilateral: H52.03

## 2024-09-02 HISTORY — DX: Unspecified chronic gastritis without bleeding: K29.50

## 2024-09-02 HISTORY — DX: Other hyperlipidemia: E78.49

## 2024-09-02 HISTORY — DX: Anemia, unspecified: D64.9

## 2024-09-02 HISTORY — PX: COLONOSCOPY: SHX5424

## 2024-09-02 HISTORY — DX: Hypermetropia, bilateral: H52.223

## 2024-09-02 HISTORY — DX: Urticaria, unspecified: L50.9

## 2024-09-02 LAB — POCT PREGNANCY, URINE: Preg Test, Ur: NEGATIVE

## 2024-09-02 SURGERY — COLONOSCOPY
Anesthesia: General

## 2024-09-02 MED ORDER — LIDOCAINE HCL (PF) 2 % IJ SOLN
INTRAMUSCULAR | Status: AC
  Filled 2024-09-02: qty 5

## 2024-09-02 MED ORDER — LIDOCAINE HCL (CARDIAC) PF 100 MG/5ML IV SOSY
PREFILLED_SYRINGE | INTRAVENOUS | Status: DC | PRN
  Administered 2024-09-02: 100 mg via INTRAVENOUS

## 2024-09-02 MED ORDER — SODIUM CHLORIDE 0.9 % IV SOLN: INTRAVENOUS | Status: DC

## 2024-09-02 MED ORDER — PROPOFOL 10 MG/ML IV BOLUS
INTRAVENOUS | Status: DC | PRN
  Administered 2024-09-02: 120 ug/kg/min via INTRAVENOUS
  Administered 2024-09-02: 80 mg via INTRAVENOUS

## 2024-09-02 MED ORDER — PROPOFOL 10 MG/ML IV BOLUS
INTRAVENOUS | Status: AC
  Filled 2024-09-02: qty 40

## 2024-09-02 NOTE — Anesthesia Postprocedure Evaluation (Signed)
 Anesthesia Post Note  Patient: Meredith Carpenter  Procedure(s) Performed: COLONOSCOPY  Patient location during evaluation: Endoscopy Anesthesia Type: General Level of consciousness: awake and alert Pain management: pain level controlled Vital Signs Assessment: post-procedure vital signs reviewed and stable Respiratory status: spontaneous breathing, nonlabored ventilation, respiratory function stable and patient connected to nasal cannula oxygen Cardiovascular status: blood pressure returned to baseline and stable Postop Assessment: no apparent nausea or vomiting Anesthetic complications: no   No notable events documented.   Last Vitals:  Vitals:   09/02/24 0848 09/02/24 0858  BP: 93/77 110/78  Pulse: 79 73  Resp: 17 14  Temp:    SpO2: 100% 100%    Last Pain:  Vitals:   09/02/24 0858  TempSrc:   PainSc: 0-No pain                 Prentice Murphy

## 2024-09-02 NOTE — Anesthesia Preprocedure Evaluation (Signed)
 Anesthesia Evaluation  Patient identified by MRN, date of birth, ID band Patient awake    Reviewed: Allergy & Precautions, H&P , NPO status , Patient's Chart, lab work & pertinent test results, reviewed documented beta blocker date and time   Airway Mallampati: I  TM Distance: >3 FB Neck ROM: full    Dental  (+) Dental Advidsory Given, Teeth Intact   Pulmonary neg shortness of breath, asthma , neg sleep apnea, neg recent URI   Pulmonary exam normal breath sounds clear to auscultation       Cardiovascular Exercise Tolerance: Good negative cardio ROS Normal cardiovascular exam Rhythm:regular Rate:Normal     Neuro/Psych negative neurological ROS  negative psych ROS   GI/Hepatic Neg liver ROS,GERD  ,,  Endo/Other  negative endocrine ROS    Renal/GU negative Renal ROS  negative genitourinary   Musculoskeletal   Abdominal   Peds  Hematology negative hematology ROS (+)   Anesthesia Other Findings Past Medical History: No date: Abnormal uterine bleeding (AUB) No date: Anemia No date: Asthma No date: Chronic gastritis No date: Dry eye syndrome of both eyes No date: Familial hyperlipidemia No date: GERD (gastroesophageal reflux disease) No date: Hives of unknown origin No date: Hyperopia of both eyes with regular astigmatism and  presbyopia 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 No date: Rosacea No date: Vitamin D  deficiency   Reproductive/Obstetrics negative OB ROS                              Anesthesia Physical Anesthesia Plan  ASA: 2  Anesthesia Plan: General   Post-op Pain Management:    Induction: Intravenous  PONV Risk Score and Plan: 3 and Propofol  infusion, TIVA and Treatment may vary due to age or medical condition  Airway Management Planned: Natural Airway and Nasal Cannula  Additional Equipment:   Intra-op Plan:    Post-operative Plan:   Informed Consent: I have reviewed the patients History and Physical, chart, labs and discussed the procedure including the risks, benefits and alternatives for the proposed anesthesia with the patient or authorized representative who has indicated his/her understanding and acceptance.     Dental Advisory Given  Plan Discussed with: Anesthesiologist, CRNA and Surgeon  Anesthesia Plan Comments:          Anesthesia Quick Evaluation

## 2024-09-02 NOTE — H&P (Signed)
 Outpatient short stay form Pre-procedure 09/02/2024  Meredith ONEIDA Schick, MD  Primary Physician: Edman Marsa PARAS, DO  Reason for visit:  Screening  History of present illness:    53 y/o lady with history of GERD and family history of cholangiocarcinoma here for index screening colonoscopy. No blood thinners. No family history of colon cancer. No abdominal surgeries.    Current Facility-Administered Medications:    0.9 %  sodium chloride  infusion, , Intravenous, Continuous, Addilyne Backs, Meredith ONEIDA, MD, Last Rate: 20 mL/hr at 09/02/24 0736, New Bag at 09/02/24 0736  Medications Prior to Admission  Medication Sig Dispense Refill Last Dose/Taking   Cholecalciferol (VITAMIN D -3) 1000 units CAPS Take 1 capsule (1,000 Units total) by mouth daily. 60 capsule  Past Week   esomeprazole  (NEXIUM ) 20 MG capsule Take 1 capsule (20 mg total) by mouth 2 (two) times daily before a meal.   09/02/2024 at  5:00 AM   ferrous sulfate  325 (65 FE) MG tablet Take 1 tablet (325 mg total) by mouth daily with breakfast. 60 tablet 5 Past Week   Multiple Vitamin (MULTIVITAMIN) capsule Take 1 capsule by mouth daily.   Past Week   albuterol  (VENTOLIN  HFA) 108 (90 Base) MCG/ACT inhaler Inhale 2 puffs into the lungs every 6 (six) hours as needed for wheezing or shortness of breath. 8 g 3    Docusate Calcium  (STOOL SOFTENER PO) Take by mouth as needed.      estradiol  (ESTRACE ) 0.1 MG/GM vaginal cream Place 1 Applicatorful vaginally.      Estradiol  1.25 MG/1.25GM GEL Place 1.25 mg onto the skin.      Evolocumab  (REPATHA  SURECLICK) 140 MG/ML SOAJ Inject 140 mg into the skin every 14 (fourteen) days.      progesterone  (PROMETRIUM ) 100 MG capsule Take 100 mg by mouth.      tacrolimus  (PROTOPIC ) 0.03 % ointment Apply twice daily to affected areas as needed for rash on eyelids (Patient not taking: Reported on 05/20/2024)        Allergies  Allergen Reactions   Atorvastatin     myalgias   Crestor  [Rosuvastatin ]      myalgias     Past Medical History:  Diagnosis Date   Abnormal uterine bleeding (AUB)    Anemia    Asthma    Chronic gastritis    Dry eye syndrome of both eyes    Familial hyperlipidemia    GERD (gastroesophageal reflux disease)    Hives of unknown origin    Hyperopia of both eyes with regular astigmatism and presbyopia    Laryngopharyngeal reflux disease    Miosis    Recurrent cold sores    Rosacea    Vitamin D  deficiency     Review of systems:  Otherwise negative.    Physical Exam  Gen: Alert, oriented. Appears stated age.  HEENT: PERRLA. Lungs: No respiratory distress CV: RRR Abd: soft, benign, no masses Ext: No edema    Planned procedures: Proceed with colonoscopy. The patient understands the nature of the planned procedure, indications, risks, alternatives and potential complications including but not limited to bleeding, infection, perforation, damage to internal organs and possible oversedation/side effects from anesthesia. The patient agrees and gives consent to proceed.  Please refer to procedure notes for findings, recommendations and patient disposition/instructions.     Meredith ONEIDA Schick, MD [facility]

## 2024-09-02 NOTE — Transfer of Care (Signed)
 Immediate Anesthesia Transfer of Care Note  Patient: Meredith Carpenter  Procedure(s) Performed: COLONOSCOPY  Patient Location: Endoscopy Unit  Anesthesia Type:General  Level of Consciousness: drowsy  Airway & Oxygen Therapy: Patient Spontanous Breathing  Post-op Assessment: Report given to RN and Post -op Vital signs reviewed and stable  Post vital signs: Reviewed and stable  Last Vitals:  Vitals Value Taken Time  BP 100/73 09/02/24 08:38  Temp 35.8 C 09/02/24 08:38  Pulse 81 09/02/24 08:38  Resp 17 09/02/24 08:38  SpO2 100 % 09/02/24 08:38    Last Pain:  Vitals:   09/02/24 0838  TempSrc: Temporal  PainSc: Asleep         Complications: No notable events documented.

## 2024-09-02 NOTE — Interval H&P Note (Signed)
 History and Physical Interval Note:  09/02/2024 8:15 AM  Meredith Carpenter  has presented today for surgery, with the diagnosis of Colon Cancer Screening.  The various methods of treatment have been discussed with the patient and family. After consideration of risks, benefits and other options for treatment, the patient has consented to  Procedure(s): COLONOSCOPY (N/A) as a surgical intervention.  The patient's history has been reviewed, patient examined, no change in status, stable for surgery.  I have reviewed the patient's chart and labs.  Questions were answered to the patient's satisfaction.     Meredith Carpenter  Ok to proceed with colonoscopy

## 2024-09-02 NOTE — Op Note (Signed)
 [facility] Patient Name: Meredith Carpenter Procedure Date: 09/02/2024 7:12 AM MRN: [id number] Account #: [id number] Date of Birth: 12-23-1970 Admit Type: Outpatient Age: 53 Room: [facility] ENDO ROOM 3 Gender: Female Note Status: Finalized Instrument Name: Colon Scope [id number] Procedure:             Colonoscopy Indications:           Screening for colorectal malignant neoplasm Providers:             Ole Schick MD, MD Referring MD:          Marsa DOROTHA Officer (Referring MD) Medicines:             Monitored Anesthesia Care Complications:         No immediate complications. Procedure:             Pre-Anesthesia Assessment:                        - Prior to the procedure, a History and Physical was                         performed, and patient medications and allergies were                         reviewed. The patient is competent. The risks and                         benefits of the procedure and the sedation options and                         risks were discussed with the patient. All questions                         were answered and informed consent was obtained.                         Patient identification and proposed procedure were                         verified by the physician, the nurse, the                         anesthesiologist, the anesthetist and the technician                         in the endoscopy suite. Mental Status Examination:                         alert and oriented. Airway Examination: normal                         oropharyngeal airway and neck mobility. Respiratory                         Examination: clear to auscultation. CV Examination:                         normal. Prophylactic Antibiotics: The patient does not  require prophylactic antibiotics. Prior                         Anticoagulants: The patient has taken no anticoagulant                         or antiplatelet agents. ASA  Grade Assessment: II - A                         patient with mild systemic disease. After reviewing                         the risks and benefits, the patient was deemed in                         satisfactory condition to undergo the procedure. The                         anesthesia plan was to use monitored anesthesia care                         (MAC). Immediately prior to administration of                         medications, the patient was re-assessed for adequacy                         to receive sedatives. The heart rate, respiratory                         rate, oxygen saturations, blood pressure, adequacy of                         pulmonary ventilation, and response to care were                         monitored throughout the procedure. The physical                         status of the patient was re-assessed after the                         procedure.                        After obtaining informed consent, the colonoscope was                         passed under direct vision. Throughout the procedure,                         the patient's blood pressure, pulse, and oxygen                         saturations were monitored continuously. The                         Colonoscope was introduced through the anus and  advanced to the the cecum, identified by appendiceal                         orifice and ileocecal valve. The colonoscopy was                         performed without difficulty. The patient tolerated                         the procedure well. The quality of the bowel                         preparation was good. The ileocecal valve, appendiceal                         orifice, and rectum were photographed. Findings:      The perianal and digital rectal examinations were normal.      Internal hemorrhoids were found during retroflexion. The hemorrhoids       were Grade I (internal hemorrhoids that do not prolapse).      The exam was  otherwise without abnormality on direct and retroflexion       views. Impression:            - Internal hemorrhoids.                        - The examination was otherwise normal on direct and                         retroflexion views.                        - No specimens collected. Recommendation:        - Discharge patient to home.                        - Resume previous diet.                        - Continue present medications.                        - Repeat colonoscopy in 10 years for screening                         purposes.                        - Return to referring physician as previously                         scheduled. Procedure Code(s):     --- Professional ---                        H9878, Colorectal cancer screening; colonoscopy on                         individual not meeting criteria for high risk Diagnosis Code(s):     --- Professional ---  Z12.11, Encounter for screening for malignant neoplasm                         of colon                        K64.0, First degree hemorrhoids CPT copyright 2022 [employer]. All rights reserved. The codes documented in this report are preliminary and upon coder review may  be revised to meet current compliance requirements. Ole Schick MD, MD 09/02/2024 8:39:21 AM Number of Addenda: 0 Note Initiated On: 09/02/2024 7:12 AM Scope Withdrawal Time: 0 hours 7 minutes 50 seconds  Total Procedure Duration: 0 hours 13 minutes 4 seconds  Estimated Blood Loss:  Estimated blood loss: none.      [facility]

## 2024-09-07 LAB — GENECONNECT MOLECULAR SCREEN: Genetic Analysis Overall Interpretation: NEGATIVE

## 2024-09-30 ENCOUNTER — Ambulatory Visit

## 2024-09-30 VITALS — BP 124/90 | HR 87 | Ht 60.0 in | Wt 102.6 lb

## 2024-09-30 DIAGNOSIS — R051 Acute cough: Secondary | ICD-10-CM

## 2024-09-30 DIAGNOSIS — J4520 Mild intermittent asthma, uncomplicated: Secondary | ICD-10-CM | POA: Diagnosis not present

## 2024-09-30 DIAGNOSIS — J069 Acute upper respiratory infection, unspecified: Secondary | ICD-10-CM | POA: Diagnosis not present

## 2024-09-30 LAB — POC COVID19/FLU A&B COMBO
Covid Antigen, POC: NEGATIVE
Influenza A Antigen, POC: NEGATIVE
Influenza B Antigen, POC: NEGATIVE

## 2024-09-30 MED ORDER — BENZONATATE 200 MG PO CAPS: 200.0000 mg | ORAL_CAPSULE | Freq: Two times a day (BID) | ORAL | 1 refills | Status: AC | PRN

## 2024-09-30 MED ORDER — PSEUDOEPH-BROMPHEN-DM 30-2-10 MG/5ML PO SYRP: 5.0000 mL | ORAL_SOLUTION | Freq: Four times a day (QID) | ORAL | 0 refills | Status: AC | PRN

## 2024-09-30 NOTE — Progress Notes (Signed)
 + -     Acute Patient Visit   + Physician: Elina Streng A Adon Gehlhausen, MD  Patient: Meredith Carpenter MRN: [id number] DOB: 1971-03-31 PCP: Edman Marsa PARAS, DO     Subjective:   Chief Complaint  Patient presents with   Influenza    Patient is concerned about flu. Body aches, coughing with phlegm.   HPI:  Discussed the use of AI scribe software for clinical note transcription with the patient, who gave verbal consent to proceed.  History of Present Illness   Meredith Carpenter is a 54 year old female with asthma who presents with symptoms of a respiratory infection.  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   URTI (acute upper respiratory infection) Yes    No orders of the defined types were placed in this encounter.   Assessment and Plan    Acute upper respiratory infection Symptoms suggest bronchitis. Lungs clear, reducing pneumonia likelihood.  - Swabbed for influenza and COVID-19. Negative result - Provided symptomatic relief with cough medicine if needed.  Tessalon  perles and brompheniramine cough syrup - Advised taking a few days off work for rest.  Asthma Managed with albuterol  inhaler. No current wheezing or tightness. - Continue albuterol  inhaler as needed. - Monitor for signs  of exacerbation.    F/u with PCP if any change in symptoms or worsening.     OBJECTIVE: Vitals:   09/30/24 1359  BP: (!) 124/90  Pulse: 87  SpO2: 99%  Weight: 102 lb 9.6 oz (46.5 kg)  Height: 5' (1.524 m)    Body mass index is 20.04 kg/m.   Physical Exam Vitals reviewed.  Constitutional:      Appearance: Normal appearance. Well-developed with normal weight.  Cardiovascular:     Rate and Rhythm: Normal rate and regular rhythm. Normal heart sounds. Normal peripheral pulses Pulmonary:     Normal breath sounds with normal effort.  No wheezing, no increased work of breathing.  SaO2 97%.  Skin:    General: Skin is warm and dry without noticeable rash. Neurological:     General: No focal deficit present.  Psychiatric:        Mood and Affect: Mood, behavior and cognition normal       Allergies Patient is allergic to atorvastatin and crestor  [rosuvastatin ].  Past Medical History Patient  has a past medical history of Abnormal uterine bleeding (AUB), Allergy, Anemia, Asthma, Chronic gastritis, Dry eye syndrome of both eyes, Familial hyperlipidemia, GERD (gastroesophageal reflux disease), Hives of unknown origin, Hyperopia of both eyes with regular astigmatism and presbyopia, Laryngopharyngeal reflux disease, Miosis, Recurrent cold sores, Rosacea, and Vitamin D  deficiency.  Surgical History Patient  has a past surgical history that includes Tonsillectomy and adenoidectomy (1979); Esophagogastroduodenoscopy (2014); Esophagogastroduodenoscopy (egd) with propofol  (N/A, 01/25/2016); and Colonoscopy (N/A, 09/02/2024).  Family History Pateint's family history includes Diabetes in her brother, father, mother,  paternal uncle, sister, and sister; Heart attack in her maternal uncle; Heart attack (age of onset: 79) in her mother; Heart attack (age of onset: 54) in her father; Heart disease in her father, maternal grandfather, maternal uncle, mother, paternal grandfather, and sister; Heart  disease (age of onset: 40) in her sister; Heart disease (age of onset: 68) in her brother; Hyperlipidemia in her mother; Hypertension in her brother, father, mother, and sister; Liver cancer in her mother.  Social History Patient  reports that she has never smoked. She has never used smokeless tobacco. She reports that she does not drink alcohol and does not use drugs.    09/30/2024

## 2025-05-19 ENCOUNTER — Encounter: Admitting: Family Medicine
